# Patient Record
Sex: Female | Born: 1960 | Race: White | Hispanic: No | Smoking: Current every day smoker
Health system: Southern US, Community
[De-identification: ages and names within clinical notes are randomized; demographics above are authoritative.]

## PROBLEM LIST (undated history)

## (undated) DIAGNOSIS — J069 Acute upper respiratory infection, unspecified: Secondary | ICD-10-CM

## (undated) DIAGNOSIS — M1990 Unspecified osteoarthritis, unspecified site: Secondary | ICD-10-CM

## (undated) DIAGNOSIS — F32A Depression, unspecified: Secondary | ICD-10-CM

## (undated) DIAGNOSIS — E782 Mixed hyperlipidemia: Secondary | ICD-10-CM

## (undated) DIAGNOSIS — I10 Essential (primary) hypertension: Secondary | ICD-10-CM

## (undated) DIAGNOSIS — F329 Major depressive disorder, single episode, unspecified: Secondary | ICD-10-CM

## (undated) HISTORY — DX: Mixed hyperlipidemia: E78.2

## (undated) HISTORY — PX: ANKLE FUSION: SHX881

## (undated) HISTORY — PX: OTHER SURGICAL HISTORY: SHX169

---

## 1998-06-22 ENCOUNTER — Other Ambulatory Visit: Admission: RE | Admit: 1998-06-22 | Discharge: 1998-06-22 | Payer: Self-pay | Admitting: *Deleted

## 1999-05-07 ENCOUNTER — Other Ambulatory Visit: Admission: RE | Admit: 1999-05-07 | Discharge: 1999-05-07 | Payer: Self-pay | Admitting: *Deleted

## 2001-05-26 ENCOUNTER — Encounter: Payer: Self-pay | Admitting: Emergency Medicine

## 2001-05-26 ENCOUNTER — Emergency Department (HOSPITAL_COMMUNITY): Admission: EM | Admit: 2001-05-26 | Discharge: 2001-05-26 | Payer: Self-pay | Admitting: Emergency Medicine

## 2006-01-10 ENCOUNTER — Ambulatory Visit (HOSPITAL_COMMUNITY): Admission: RE | Admit: 2006-01-10 | Discharge: 2006-01-10 | Payer: Self-pay | Admitting: Internal Medicine

## 2006-08-21 ENCOUNTER — Ambulatory Visit (HOSPITAL_COMMUNITY): Admission: RE | Admit: 2006-08-21 | Discharge: 2006-08-21 | Payer: Self-pay | Admitting: Family Medicine

## 2006-10-17 ENCOUNTER — Ambulatory Visit (HOSPITAL_COMMUNITY): Admission: RE | Admit: 2006-10-17 | Discharge: 2006-10-17 | Payer: Self-pay | Admitting: Internal Medicine

## 2009-11-30 ENCOUNTER — Ambulatory Visit (HOSPITAL_COMMUNITY): Admission: RE | Admit: 2009-11-30 | Discharge: 2009-11-30 | Payer: Self-pay | Admitting: Internal Medicine

## 2010-09-03 NOTE — Procedures (Signed)
NAMESALIHA, SALTS                 ACCOUNT NO.:  [id number]   MEDICAL RECORD NO.:  [id number]          PATIENT TYPE:  OUT   LOCATION:  RESP                          FACILITY:  [facility]   PHYSICIAN:  Edward L. Juanetta Gosling, M.D.DATE OF BIRTH:  1961-02-01   DATE OF PROCEDURE:  DATE OF DISCHARGE:                            PULMONARY FUNCTION TEST   1. Spirometry shows no definite ventilatory defect, but evidence of      airflow obstruction most marked in the smaller airways, but present      in the larger airways as well.  2. Lung volumes show air trapping.  3. DLCO is normal.  4. Arterial blood gases are normal.  5. There is no significant bronchodilator effect.      Edward L. Juanetta Gosling, M.D.  Electronically Signed     ELH/MEDQ  D:  08/21/2006  T:  08/22/2006  Job:  [id number]   cc:   Madelin Rear. Sherwood Gambler, MD  Fax: [phone]

## 2010-11-09 ENCOUNTER — Telehealth: Payer: Self-pay

## 2010-11-09 NOTE — Telephone Encounter (Signed)
Molly Herrera called and LMOM to call on 11/04/2010. I called this AM , many rings and no answer.

## 2010-11-11 NOTE — Telephone Encounter (Signed)
Letter mailed to pt to call to get scheduled for a colonoscopy. Referred by Dr. Sherwood Gambler.

## 2011-05-05 ENCOUNTER — Telehealth: Payer: Self-pay

## 2011-05-05 NOTE — Telephone Encounter (Signed)
Pt referred from Dr. Sherwood Gambler for a screening colonoscopy. ( see med lists) needs OV prior to colonoscopy.

## 2011-05-09 NOTE — Telephone Encounter (Signed)
LMOM to call.

## 2011-05-10 NOTE — Telephone Encounter (Signed)
LMOM to call. Mailing a letter to call also.  

## 2011-08-18 NOTE — H&P (Signed)
NTS SOAP Note  Vital Signs:  Vitals as of: 08/18/2011: Systolic 131: Diastolic 85: Heart Rate 88: Temp 97.71F: Height 38ft 2in: Weight 135Lbs 0 Ounces: OFC Not Entered: Respiratory Rate Not Entered: O2 Saturation Not Entered: Pain Level Not Entered: BMI 25  BMI : 24.69 kg/m2  Subjective: This 34 Years 22 Months old Female presents forscreening TCS.  Never has had a colonoscopy.  Denies any GI complaints.  Review of Symptoms:  Constitutional:fever/cold Head:unremarkable Eyes:blurred vision bilateral Nose/Mouth/Throat:unremarkable Cardiovascular:unremarkable Respiratory:wheezing,cough Gastrointestinal:unremarkable Genitourinary:unremarkable Musculoskeletal:unremarkable Skin:unremarkable Hematolgic/Lymphatic:unremarkable Allergic/Immunologic:unremarkable   Past Medical History:Reviewed   Past Medical History  Surgical History: ankle x 3 Medical Problems:  Emphysema, High Blood pressure Allergies: nkda Medications: hydrocodone, chantix, enalapril, citalopam, xanax, albuterol   Social History:Reviewed  Social History  Preferred Language: English ([place]) Race:  White Ethnicity: Not Hispanic / Latino Age: 51 Years 9 Months Marital Status:  S Alcohol: socially Recreational drug(s):  No   Smoking Status: Current every day smoker reviewed on 08/18/2011 Started Date: 04/18/1978 Packs per day: 0.50   Family History:Reviewed   Family History  Is there a family history of:no family h/o colon cancer    Objective Information: General:Well appearing, well nourished in no distress. Head:Atraumatic; no masses; no abnormalities Heart:RRR, no murmur or gallop.  Normal S1, S2.  No S3, S4.  Lungs:CTA bilaterally, no wheezes, rhonchi, rales.  Breathing unlabored. Abdomen:Soft, NT/ND, no HSM, no masses. deferred to procedure  Assessment:Need for screening TCS  Diagnosis &amp; Procedure:  DiagnosisCode: V76.51, ProcedureCode: 40981,    Plan:Scheduled for TCS on 08/26/11.     Patient Education:Alternative treatments to surgery were discussed with patient (and family).Risks and benefits  of procedure were fully explained to the patient (and family) who gave informed consent. Patient/family questions were addressed.  Follow-up:Pending Surgery

## 2011-08-24 ENCOUNTER — Encounter (HOSPITAL_COMMUNITY): Payer: Self-pay | Admitting: Pharmacy Technician

## 2011-08-25 MED ORDER — SODIUM CHLORIDE 0.45 % IV SOLN
Freq: Once | INTRAVENOUS | Status: AC
  Administered 2011-08-26: 07:00:00 via INTRAVENOUS

## 2011-08-26 ENCOUNTER — Encounter (HOSPITAL_COMMUNITY): Payer: Self-pay

## 2011-08-26 ENCOUNTER — Encounter (HOSPITAL_COMMUNITY)
Admission: RE | Disposition: A | Discharge status: Home / Self Care | Payer: Self-pay | Source: Ambulatory Visit | Attending: General Surgery

## 2011-08-26 ENCOUNTER — Ambulatory Visit (HOSPITAL_COMMUNITY)
Admission: RE | Admit: 2011-08-26 | Discharge: 2011-08-26 | Disposition: A | Discharge status: Home / Self Care | Payer: Medicare Other | Source: Ambulatory Visit | Attending: General Surgery | Admitting: General Surgery

## 2011-08-26 DIAGNOSIS — Z1211 Encounter for screening for malignant neoplasm of colon: Secondary | ICD-10-CM | POA: Insufficient documentation

## 2011-08-26 HISTORY — DX: Acute upper respiratory infection, unspecified: J06.9

## 2011-08-26 HISTORY — DX: Depression, unspecified: F32.A

## 2011-08-26 HISTORY — PX: COLONOSCOPY: SHX5424

## 2011-08-26 HISTORY — DX: Essential (primary) hypertension: I10

## 2011-08-26 HISTORY — DX: Major depressive disorder, single episode, unspecified: F32.9

## 2011-08-26 HISTORY — DX: Unspecified osteoarthritis, unspecified site: M19.90

## 2011-08-26 SURGERY — COLONOSCOPY
Anesthesia: Moderate Sedation

## 2011-08-26 MED ORDER — MEPERIDINE HCL 25 MG/ML IJ SOLN
INTRAMUSCULAR | Status: DC | PRN
  Administered 2011-08-26: 50 mg via INTRAVENOUS
  Administered 2011-08-26: 25 mg via INTRAVENOUS

## 2011-08-26 MED ORDER — MEPERIDINE HCL 100 MG/ML IJ SOLN
INTRAMUSCULAR | Status: AC
  Filled 2011-08-26: qty 2

## 2011-08-26 MED ORDER — STERILE WATER FOR IRRIGATION IR SOLN
Status: DC | PRN
  Administered 2011-08-26: 08:00:00

## 2011-08-26 MED ORDER — MIDAZOLAM HCL 5 MG/5ML IJ SOLN
INTRAMUSCULAR | Status: DC | PRN
  Administered 2011-08-26: 1 mg via INTRAVENOUS
  Administered 2011-08-26: 3 mg via INTRAVENOUS
  Administered 2011-08-26 (×2): 1 mg via INTRAVENOUS

## 2011-08-26 MED ORDER — MIDAZOLAM HCL 5 MG/5ML IJ SOLN
INTRAMUSCULAR | Status: AC
  Filled 2011-08-26: qty 10

## 2011-08-26 NOTE — Interval H&P Note (Signed)
History and Physical Interval Note:  08/26/2011 7:35 AM  Molly Herrera  has presented today for surgery, with the diagnosis of Special screening for malignant neoplasms, colon [V76.51]  The various methods of treatment have been discussed with the patient and family. After consideration of risks, benefits and other options for treatment, the patient has consented to  Procedure(s) (LRB): COLONOSCOPY (N/A) as a surgical intervention .  The patients' history has been reviewed, patient examined, no change in status, stable for surgery.  I have reviewed the patients' chart and labs.  Questions were answered to the patient's satisfaction.     Franky Macho A

## 2011-08-26 NOTE — Op Note (Signed)
[facility] [address] [place], [place]  [address]  COLONOSCOPY PROCEDURE REPORT  PATIENT:  Molly Herrera, Molly Herrera  MR#:  [id number] BIRTHDATE:  06-16-1960, 50 yrs. old  GENDER:  female ENDOSCOPIST:  Franky Macho, MD REF. BY:  Artis Delay, M.D. PROCEDURE DATE:  08/26/2011 PROCEDURE:  Average-risk screening colonoscopy G0121 ASA CLASS:  Class II INDICATIONS:  Screening MEDICATIONS:   Versed 6 mg IV, demerol 75 mg IV  DESCRIPTION OF PROCEDURE:   After the risks benefits and alternatives of the procedure were thoroughly explained, informed consent was obtained.  Digital rectal exam was performed and revealed no abnormalities.   The [id number] ([id number]) endoscope was introduced through the anus and advanced to the cecum, which was identified by both the appendix and ileocecal valve, without limitations.  The quality of the prep was fair..  The instrument was then slowly withdrawn as the colon was fully examined.  FINDINGS:  A normal appearing cecum, ileocecal valve, and appendiceal orifice were identified. The ascending, hepatic flexure, transverse, splenic flexure, descending, sigmoid colon, and rectum appeared unremarkable. Retroflexed views in the rectum revealed it was not tolerated by the patient.  The scope was then withdrawn from the cecum and the procedure completed. COMPLICATIONS:  None ENDOSCOPIC IMPRESSION: 1) Normal colon 2) Normal cecum 3) Normal rectum  RECOMMENDATIONS:  REPEAT EXAM:  In 10 year(s) for Colonoscopy.  ______________________________ Franky Macho, MD  CC:  Elfredia Nevins, MD  n. Rosalie DoctorFranky Macho at 08/26/2011 08:00 AM  Edwinna Areola, [phone]

## 2011-08-26 NOTE — Discharge Instructions (Signed)
Colonoscopy Care After Read the instructions outlined below and refer to this sheet in the next few weeks. These discharge instructions provide you with general information on caring for yourself after you leave the hospital. Your doctor may also give you specific instructions. While your treatment has been planned according to the most current medical practices available, unavoidable complications occasionally occur. If you have any problems or questions after discharge, call your doctor. HOME CARE INSTRUCTIONS ACTIVITY:  You may resume your regular activity, but move at a slower pace for the next 24 hours.   Take frequent rest periods for the next 24 hours.   Walking will help get rid of the air and reduce the bloated feeling in your belly (abdomen).   No driving for 24 hours (because of the medicine (anesthesia) used during the test).   You may shower.   Do not sign any important legal documents or operate any machinery for 24 hours (because of the anesthesia used during the test).  NUTRITION:  Drink plenty of fluids.   You may resume your normal diet as instructed by your doctor.   Begin with a light meal and progress to your normal diet. Heavy or fried foods are harder to digest and may make you feel sick to your stomach (nauseated).   Avoid alcoholic beverages for 24 hours or as instructed.  MEDICATIONS:  You may resume your normal medications unless your doctor tells you otherwise.  WHAT TO EXPECT TODAY:  Some feelings of bloating in the abdomen.   Passage of more gas than usual.   Spotting of blood in your stool or on the toilet paper.  IF YOU HAD POLYPS REMOVED DURING THE COLONOSCOPY:  No aspirin products for 7 days or as instructed.   No alcohol for 7 days or as instructed.   Eat a soft diet for the next 24 hours.  FINDING OUT THE RESULTS OF YOUR TEST Not all test results are available during your visit. If your test results are not back during the visit, make an  appointment with your caregiver to find out the results. Do not assume everything is normal if you have not heard from your caregiver or the medical facility. It is important for you to follow up on all of your test results.  SEEK IMMEDIATE MEDICAL CARE IF:  You have more than a spotting of blood in your stool.   Your belly is swollen (abdominal distention).   You are nauseated or vomiting.   You have a fever.   You have abdominal pain or discomfort that is severe or gets worse throughout the day.  Document Released: 11/17/2003 Document Revised: 03/24/2011 Document Reviewed: 11/15/2007 ExitCare Patient Information 2012 [employer]. 

## 2011-09-02 ENCOUNTER — Encounter (HOSPITAL_COMMUNITY): Payer: Self-pay | Admitting: General Surgery

## 2012-02-09 ENCOUNTER — Other Ambulatory Visit (HOSPITAL_COMMUNITY): Payer: Self-pay | Admitting: Internal Medicine

## 2012-02-09 DIAGNOSIS — Z139 Encounter for screening, unspecified: Secondary | ICD-10-CM

## 2012-02-09 DIAGNOSIS — R945 Abnormal results of liver function studies: Secondary | ICD-10-CM

## 2012-02-13 ENCOUNTER — Other Ambulatory Visit (HOSPITAL_COMMUNITY): Payer: Medicare Other

## 2012-02-17 ENCOUNTER — Ambulatory Visit (HOSPITAL_COMMUNITY): Payer: Medicare Other

## 2012-02-24 ENCOUNTER — Ambulatory Visit (HOSPITAL_COMMUNITY)
Admission: RE | Admit: 2012-02-24 | Discharge: 2012-02-24 | Disposition: A | Discharge status: Home / Self Care | Payer: Medicare Other | Source: Ambulatory Visit | Attending: Internal Medicine | Admitting: Internal Medicine

## 2012-02-24 DIAGNOSIS — R945 Abnormal results of liver function studies: Secondary | ICD-10-CM | POA: Insufficient documentation

## 2012-02-24 DIAGNOSIS — Z1231 Encounter for screening mammogram for malignant neoplasm of breast: Secondary | ICD-10-CM | POA: Insufficient documentation

## 2012-02-24 DIAGNOSIS — Z139 Encounter for screening, unspecified: Secondary | ICD-10-CM

## 2012-03-08 ENCOUNTER — Encounter (INDEPENDENT_AMBULATORY_CARE_PROVIDER_SITE_OTHER): Payer: Self-pay | Admitting: *Deleted

## 2012-03-09 ENCOUNTER — Ambulatory Visit (INDEPENDENT_AMBULATORY_CARE_PROVIDER_SITE_OTHER): Payer: Medicare Other | Admitting: Internal Medicine

## 2012-03-09 ENCOUNTER — Encounter (INDEPENDENT_AMBULATORY_CARE_PROVIDER_SITE_OTHER): Payer: Self-pay | Admitting: Internal Medicine

## 2012-03-09 VITALS — BP 126/62 | HR 84 | Temp 98.3°F | Ht 62.0 in | Wt 138.3 lb

## 2012-03-09 DIAGNOSIS — K838 Other specified diseases of biliary tract: Secondary | ICD-10-CM

## 2012-03-09 NOTE — Progress Notes (Signed)
Subjective:     Patient ID: Molly Herrera, female   DOB: 06-Sep-1960, 51 y.o.   MRN: [id number]  HPI Referred to our office by Dr. Sherwood Gambler for dilated CBD. She had blood work and liver enzymes were elevated. She underwent an [place] (see below). Appetite is good. No weight loss. No fevers. No abdominal pain. She has had nausea.  She has a BM every other day. No bright rectal bleeding or melena. No prior hx of jaundice.  02/08/2012 Total bili 0.9, ALP 79, Abdominal US 02/24/2012 IMPRESSION:  No cholelithiasis or evidence of acute cholecystitis. Mildly  dilated common bile duct proximally which tapers distally. This  can be further evaluated with MRCP if felt clinically indicated.  Cannot exclude distal obstructing lesion or stone as the distal  duct is obscured by bowel gas.  Review of Systems see hpi Current Outpatient Prescriptions  Medication Sig Dispense Refill  . albuterol (PROVENTIL) 2 MG tablet Take 4 mg by mouth 3 (three) times daily as needed. For wheezing       . ALPRAZolam (XANAX) 1 MG tablet Take 1 mg by mouth 4 (four) times daily as needed. For sleep or anxiety       . carboxymethylcellulose (REFRESH PLUS) 0.5 % SOLN Place 1 drop into both eyes 3 (three) times daily.      . citalopram (CELEXA) 10 MG tablet Take 10 mg by mouth every morning.      Marland Kitchen HYDROcodone-acetaminophen (LORTAB) 10-500 MG per tablet Take 1 tablet by mouth every 6 (six) hours as needed. For pain       . Multiple Vitamin (MULITIVITAMIN WITH MINERALS) TABS Take 1 tablet by mouth every morning.      . sodium chloride (MURO 128) 5 % ophthalmic solution 1 drop as needed.      . enalapril (VASOTEC) 5 MG tablet Take 5 mg by mouth every morning.       Past Medical History  Diagnosis Date  . Hypertension   . Asthma   . Recurrent upper respiratory infection (URI)   . Depression   . Arthritis    Past Medical History  Diagnosis Date  . Hypertension   . Asthma   . Recurrent upper respiratory infection (URI)   .  Depression   . Arthritis    Past Surgical History  Procedure Date  . Ankle fusion had 2 surgeries on ankle prior to fusion    right  . Colonoscopy 08/26/2011    Procedure: COLONOSCOPY;  Surgeon: Dalia Heading, MD;  Location: [facility];  Service: Gastroenterology;  Laterality: N/A;   No Known Allergies      Objective:   Physical Exam Filed Vitals:   03/09/12 0947  BP: 126/62  Pulse: 84  Temp: 98.3 F (36.8 C)  Height: 5\' 2"  (1.575 m)  Weight: 138 lb 4.8 oz (62.732 kg)   Alert and oriented. Skin warm and dry. Oral mucosa is moist.   . Sclera anicteric, conjunctivae is pink. Thyroid not enlarged. No cervical lymphadenopathy. Lungs clear. Heart regular rate and rhythm.  Abdomen is soft. Bowel sounds are positive. No hepatomegaly. No abdominal masses felt. No tenderness.  No edema to lower extremities.      Assessment:    Dilated CBD. Stone needs to be ruled. Lesion is also in the differential.   Plan:     MRCP

## 2012-03-19 ENCOUNTER — Ambulatory Visit (HOSPITAL_COMMUNITY)
Admission: RE | Admit: 2012-03-19 | Discharge: 2012-03-19 | Disposition: A | Discharge status: Home / Self Care | Payer: Medicare Other | Source: Ambulatory Visit | Attending: Internal Medicine | Admitting: Internal Medicine

## 2012-03-19 ENCOUNTER — Other Ambulatory Visit (INDEPENDENT_AMBULATORY_CARE_PROVIDER_SITE_OTHER): Payer: Self-pay | Admitting: Internal Medicine

## 2012-03-19 DIAGNOSIS — K5710 Diverticulosis of small intestine without perforation or abscess without bleeding: Secondary | ICD-10-CM | POA: Insufficient documentation

## 2012-03-19 DIAGNOSIS — K838 Other specified diseases of biliary tract: Secondary | ICD-10-CM

## 2012-03-19 MED ORDER — GADOBENATE DIMEGLUMINE 529 MG/ML IV SOLN
12.0000 mL | Freq: Once | INTRAVENOUS | Status: AC | PRN
  Administered 2012-03-19: 12 mL via INTRAVENOUS

## 2012-03-20 ENCOUNTER — Telehealth (INDEPENDENT_AMBULATORY_CARE_PROVIDER_SITE_OTHER): Payer: Self-pay | Admitting: Internal Medicine

## 2012-03-20 DIAGNOSIS — K838 Other specified diseases of biliary tract: Secondary | ICD-10-CM

## 2012-03-20 NOTE — Telephone Encounter (Signed)
Will repeat an hepatic profile. If numbers are elevated, Dr. Karilyn Cota will do an ERCP with sphincterotomy. If normal, we will repeat Hepatic profile and [place] in 2 months.

## 2012-03-21 ENCOUNTER — Encounter (INDEPENDENT_AMBULATORY_CARE_PROVIDER_SITE_OTHER): Payer: Self-pay

## 2012-03-27 LAB — HEPATIC FUNCTION PANEL
Albumin: 4.4 g/dL (ref 3.5–5.2)
Total Bilirubin: 0.5 mg/dL (ref 0.3–1.2)
Total Protein: 6.5 g/dL (ref 6.0–8.3)

## 2012-03-28 ENCOUNTER — Telehealth (INDEPENDENT_AMBULATORY_CARE_PROVIDER_SITE_OTHER): Payer: Self-pay | Admitting: Internal Medicine

## 2012-03-28 NOTE — Telephone Encounter (Signed)
Dr. Karilyn Cota, Don't forget to talk to radiologist concerning her xrays. She has a dilated CBD

## 2012-03-29 ENCOUNTER — Telehealth (INDEPENDENT_AMBULATORY_CARE_PROVIDER_SITE_OTHER): Payer: Self-pay | Admitting: Internal Medicine

## 2012-03-29 NOTE — Telephone Encounter (Signed)
Repeat LFTs and US abdomen in 2 months with Office visit.  I discussed this case with Dr. Karilyn Cota.    Molly Herrera, OV in 2 months with me

## 2012-04-02 ENCOUNTER — Telehealth (INDEPENDENT_AMBULATORY_CARE_PROVIDER_SITE_OTHER): Payer: Self-pay | Admitting: Internal Medicine

## 2012-04-02 NOTE — Telephone Encounter (Signed)
Tammy, Need LFTs in 8 weeks

## 2012-04-02 NOTE — Telephone Encounter (Signed)
[name] and I reviewed imaging studies with the radiologist on 03/30/2013. Plan is for her to have LFTs in 8 weeks followed by office visit.

## 2012-04-04 ENCOUNTER — Encounter (INDEPENDENT_AMBULATORY_CARE_PROVIDER_SITE_OTHER): Payer: Self-pay

## 2012-04-04 NOTE — Telephone Encounter (Signed)
Apt has been made for a 3 month f/u on 07/07/11 with Dorene Ar, NP. Molly Herrera is having a coronary transplante on 05/29/11. Would like to get the lab work, [place] and apt scheduled after the surgery. Forward to [place].

## 2012-04-04 NOTE — Telephone Encounter (Signed)
[place] noted for Feb 2014

## 2012-04-05 ENCOUNTER — Telehealth (INDEPENDENT_AMBULATORY_CARE_PROVIDER_SITE_OTHER): Payer: Self-pay | Admitting: *Deleted

## 2012-04-05 DIAGNOSIS — K838 Other specified diseases of biliary tract: Secondary | ICD-10-CM

## 2012-04-05 NOTE — Telephone Encounter (Signed)
Lab is noted for  8 weeks

## 2012-04-05 NOTE — Telephone Encounter (Signed)
Per Dorene Ar ,NP the patient will need to have labs in 8 weeks

## 2012-04-05 NOTE — Telephone Encounter (Signed)
Lab is noted for  8 weeks 

## 2012-05-17 ENCOUNTER — Encounter (INDEPENDENT_AMBULATORY_CARE_PROVIDER_SITE_OTHER): Payer: Self-pay | Admitting: *Deleted

## 2012-05-17 ENCOUNTER — Telehealth (INDEPENDENT_AMBULATORY_CARE_PROVIDER_SITE_OTHER): Payer: Self-pay | Admitting: *Deleted

## 2012-05-17 DIAGNOSIS — K838 Other specified diseases of biliary tract: Secondary | ICD-10-CM

## 2012-05-17 NOTE — Telephone Encounter (Signed)
Lab order printed

## 2012-07-06 ENCOUNTER — Ambulatory Visit (INDEPENDENT_AMBULATORY_CARE_PROVIDER_SITE_OTHER): Payer: Medicare Other | Admitting: Internal Medicine

## 2012-12-12 ENCOUNTER — Other Ambulatory Visit (INDEPENDENT_AMBULATORY_CARE_PROVIDER_SITE_OTHER): Payer: Self-pay | Admitting: Internal Medicine

## 2012-12-12 ENCOUNTER — Ambulatory Visit (INDEPENDENT_AMBULATORY_CARE_PROVIDER_SITE_OTHER): Payer: Medicare Other | Admitting: Internal Medicine

## 2012-12-12 ENCOUNTER — Encounter (INDEPENDENT_AMBULATORY_CARE_PROVIDER_SITE_OTHER): Payer: Self-pay | Admitting: Internal Medicine

## 2012-12-12 VITALS — BP 124/70 | HR 72 | Temp 98.2°F | Ht 62.0 in | Wt 140.0 lb

## 2012-12-12 DIAGNOSIS — K831 Obstruction of bile duct: Secondary | ICD-10-CM

## 2012-12-12 NOTE — Progress Notes (Signed)
Subjective:     Patient ID: Molly Herrera, female   DOB: [birth year], 52 y.o.   MRN: [id number]  HPI Here today for f/u of a dilated CBD. She was last seen in November of 2013. She did not follow up in our office for a repeat US and lab work. She tells corneal transplants in February and June of this year.   She has a hx of Glaucoma.  She tells me she has had some left upper quadrant pain. She has had some nausea associated with her symptoms. Pain started 2-3 months ago. She says the pain is steadily getting worse. There is no pain today.  Usually hurts in the morning and as the day progresses the pain will resolve. Appetite has been good. No weight loss. She is having a BM about once every other day. No bright red rectal bleeding or melena.  No prior of jaundice. She has 2-3 mixed drinks a day and drinks an 18 pack on the weekends over a 2 day period.   Hepatic Function Panel     Component Value Date/Time   PROT 6.5 03/20/2012 1633   ALBUMIN 4.4 03/20/2012 1633   AST 44* 03/20/2012 1633   ALT 45* 03/20/2012 1633   ALKPHOS 76 03/20/2012 1633   BILITOT 0.5 03/20/2012 1633   BILIDIR 0.1 03/20/2012 1633   IBILI 0.4 03/20/2012 1633     03/09/12 MRCP: dilated CBD 1. Mild intra hepatic ductal dilatation and common bile duct  dilatation but no common bile duct stones or pancreatic head mass  to account for this finding. There could be a distal common bile  duct stricture.  2. The pancreatic duct is normal in caliber and has a normal  course.  3. Normal gallbladder. No gallstones.  4. 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     Notes Recorded by Len Blalock, NP on 03/20/2012 at 4:32 PM Will repeat a hepatic function. If elevated ERCP with sphincterotomy. If normal. Hepatic function and US abdomen in 2 months. I discussed this case with Dr. Karilyn Cota.     Review of Systems see hpi Current Outpatient Prescriptions  Medication Sig Dispense Refill  . albuterol (PROVENTIL) 2 MG tablet Take 4  mg by mouth 3 (three) times daily as needed. For wheezing       . ALPRAZolam (XANAX) 1 MG tablet Take 1 mg by mouth 4 (four) times daily as needed. For sleep or anxiety       . citalopram (CELEXA) 10 MG tablet Take 10 mg by mouth every morning.      . enalapril (VASOTEC) 5 MG tablet Take 5 mg by mouth every morning.      Marland Kitchen HYDROcodone-acetaminophen (LORTAB) 10-500 MG per tablet Take 1 tablet by mouth every 6 (six) hours as needed. For pain       . prednisoLONE acetate (PRED FORTE) 1 % ophthalmic suspension Place 1 drop into both eyes 4 (four) times daily. Rt eye three times a day, left once a day       No current facility-administered medications for this visit.   Past Medical History  Diagnosis Date  . Hypertension   . Asthma   . Recurrent upper respiratory infection (URI)   . Depression   . Arthritis    Past Surgical History  Procedure Laterality Date  . Ankle fusion  had 2 surgeries on ankle prior to fusion    right  . Colonoscopy  08/26/2011    Procedure: COLONOSCOPY;  Surgeon: Loraine Leriche  Val Riles, MD;  Location: [facility];  Service: Gastroenterology;  Laterality: N/A;  . Corneal transplants      2014   No Known Allergies      Objective:   Physical Exam  Filed Vitals:   12/12/12 0946  BP: 124/70  Pulse: 72  Temp: 98.2 F (36.8 C)  Height: 5\' 2"  (1.575 m)  Weight: 140 lb (63.504 kg)   Alert and oriented. Skin warm and dry. Oral mucosa is moist.   . Sclera anicteric, conjunctivae is pink. Thyroid not enlarged. No cervical lymphadenopathy. Lungs clear. Heart regular rate and rhythm.  Abdomen is soft. Bowel sounds are positive. No hepatomegaly. No abdominal masses felt. No tenderness.  No edema to lower extremities.       Assessment:    Hx of dilated CBD. ? etiology   Patient was to follow up with [place] several months ago but did not. She had corneal implants and post-poned labs and [place].  Plan:    Cmet, Amylase, US abdomen. If abnormal, I need to discuss results with Dr.  Karilyn Cota after I have the results. She may need an ERCP.   Also advised she needed to decrease her drinking.

## 2012-12-12 NOTE — Patient Instructions (Addendum)
CMET and US abdomen, Amylase. Further recommendations to follow.

## 2012-12-19 ENCOUNTER — Other Ambulatory Visit (INDEPENDENT_AMBULATORY_CARE_PROVIDER_SITE_OTHER): Payer: Self-pay | Admitting: Internal Medicine

## 2012-12-19 ENCOUNTER — Ambulatory Visit (HOSPITAL_COMMUNITY)
Admission: RE | Admit: 2012-12-19 | Discharge: 2012-12-19 | Disposition: A | Discharge status: Home / Self Care | Payer: Medicare Other | Source: Ambulatory Visit | Attending: Internal Medicine | Admitting: Internal Medicine

## 2012-12-19 ENCOUNTER — Telehealth (INDEPENDENT_AMBULATORY_CARE_PROVIDER_SITE_OTHER): Payer: Self-pay | Admitting: Internal Medicine

## 2012-12-19 DIAGNOSIS — K831 Obstruction of bile duct: Secondary | ICD-10-CM

## 2012-12-19 DIAGNOSIS — K7689 Other specified diseases of liver: Secondary | ICD-10-CM | POA: Insufficient documentation

## 2012-12-19 DIAGNOSIS — K838 Other specified diseases of biliary tract: Secondary | ICD-10-CM | POA: Insufficient documentation

## 2012-12-19 NOTE — Telephone Encounter (Signed)
erroir

## 2012-12-20 LAB — COMPREHENSIVE METABOLIC PANEL
ALT: 37 U/L — ABNORMAL HIGH (ref 0–35)
AST: 47 U/L — ABNORMAL HIGH (ref 0–37)
Albumin: 4.5 g/dL (ref 3.5–5.2)
CO2: 27 mEq/L (ref 19–32)
Calcium: 9.4 mg/dL (ref 8.4–10.5)
Chloride: 102 mEq/L (ref 96–112)
Potassium: 4 mEq/L (ref 3.5–5.3)
Total Protein: 6.5 g/dL (ref 6.0–8.3)

## 2012-12-20 LAB — AMYLASE: Amylase: 68 U/L (ref 0–105)

## 2012-12-26 NOTE — Progress Notes (Signed)
Has an apt scheduled for 02/11/13 with Dr. Karilyn Cota.

## 2012-12-27 ENCOUNTER — Telehealth (INDEPENDENT_AMBULATORY_CARE_PROVIDER_SITE_OTHER): Payer: Self-pay | Admitting: *Deleted

## 2012-12-27 NOTE — Telephone Encounter (Signed)
.  Per Delrae Rend the patient will need to have labs in 3 months and a office visit. Labs have been noted for March 28, 2013.

## 2012-12-28 NOTE — Progress Notes (Signed)
Apt has been scheduled for 04/08/13 with Dr. Karilyn Cota.

## 2012-12-28 NOTE — Telephone Encounter (Signed)
Apt has been changed to 04/08/13 with Dr. Karilyn Cota.

## 2013-02-11 ENCOUNTER — Ambulatory Visit (INDEPENDENT_AMBULATORY_CARE_PROVIDER_SITE_OTHER): Payer: Medicare Other | Admitting: Internal Medicine

## 2013-03-06 ENCOUNTER — Encounter (INDEPENDENT_AMBULATORY_CARE_PROVIDER_SITE_OTHER): Payer: Self-pay | Admitting: *Deleted

## 2013-03-06 ENCOUNTER — Other Ambulatory Visit (INDEPENDENT_AMBULATORY_CARE_PROVIDER_SITE_OTHER): Payer: Self-pay | Admitting: *Deleted

## 2013-04-08 ENCOUNTER — Ambulatory Visit (INDEPENDENT_AMBULATORY_CARE_PROVIDER_SITE_OTHER): Payer: Medicare Other | Admitting: Internal Medicine

## 2013-05-23 ENCOUNTER — Other Ambulatory Visit (HOSPITAL_COMMUNITY): Payer: Self-pay | Admitting: Family Medicine

## 2013-05-23 ENCOUNTER — Ambulatory Visit (HOSPITAL_COMMUNITY)
Admission: RE | Admit: 2013-05-23 | Discharge: 2013-05-23 | Disposition: A | Discharge status: Home / Self Care | Payer: Medicare Other | Source: Ambulatory Visit | Attending: Family Medicine | Admitting: Family Medicine

## 2013-05-23 DIAGNOSIS — R0602 Shortness of breath: Secondary | ICD-10-CM | POA: Insufficient documentation

## 2013-05-23 DIAGNOSIS — R059 Cough, unspecified: Secondary | ICD-10-CM

## 2013-05-23 DIAGNOSIS — R05 Cough: Secondary | ICD-10-CM

## 2014-04-25 DIAGNOSIS — F419 Anxiety disorder, unspecified: Secondary | ICD-10-CM | POA: Diagnosis not present

## 2014-04-25 DIAGNOSIS — I10 Essential (primary) hypertension: Secondary | ICD-10-CM | POA: Diagnosis not present

## 2014-04-25 DIAGNOSIS — Z6824 Body mass index (BMI) 24.0-24.9, adult: Secondary | ICD-10-CM | POA: Diagnosis not present

## 2014-04-25 DIAGNOSIS — J0100 Acute maxillary sinusitis, unspecified: Secondary | ICD-10-CM | POA: Diagnosis not present

## 2014-04-25 DIAGNOSIS — G894 Chronic pain syndrome: Secondary | ICD-10-CM | POA: Diagnosis not present

## 2014-05-16 DIAGNOSIS — Z947 Corneal transplant status: Secondary | ICD-10-CM | POA: Diagnosis not present

## 2014-05-16 DIAGNOSIS — H1851 Endothelial corneal dystrophy: Secondary | ICD-10-CM | POA: Diagnosis not present

## 2014-07-25 DIAGNOSIS — G894 Chronic pain syndrome: Secondary | ICD-10-CM | POA: Diagnosis not present

## 2014-07-25 DIAGNOSIS — Z6823 Body mass index (BMI) 23.0-23.9, adult: Secondary | ICD-10-CM | POA: Diagnosis not present

## 2014-07-25 DIAGNOSIS — F419 Anxiety disorder, unspecified: Secondary | ICD-10-CM | POA: Diagnosis not present

## 2014-09-26 DIAGNOSIS — Z0000 Encounter for general adult medical examination without abnormal findings: Secondary | ICD-10-CM | POA: Diagnosis not present

## 2014-09-26 DIAGNOSIS — J45909 Unspecified asthma, uncomplicated: Secondary | ICD-10-CM | POA: Diagnosis not present

## 2014-09-26 DIAGNOSIS — Z1389 Encounter for screening for other disorder: Secondary | ICD-10-CM | POA: Diagnosis not present

## 2014-09-26 DIAGNOSIS — Z6824 Body mass index (BMI) 24.0-24.9, adult: Secondary | ICD-10-CM | POA: Diagnosis not present

## 2015-01-21 DIAGNOSIS — F419 Anxiety disorder, unspecified: Secondary | ICD-10-CM | POA: Diagnosis not present

## 2015-01-21 DIAGNOSIS — J019 Acute sinusitis, unspecified: Secondary | ICD-10-CM | POA: Diagnosis not present

## 2015-01-21 DIAGNOSIS — Z6826 Body mass index (BMI) 26.0-26.9, adult: Secondary | ICD-10-CM | POA: Diagnosis not present

## 2015-01-21 DIAGNOSIS — E663 Overweight: Secondary | ICD-10-CM | POA: Diagnosis not present

## 2015-01-21 DIAGNOSIS — Z23 Encounter for immunization: Secondary | ICD-10-CM | POA: Diagnosis not present

## 2015-01-21 DIAGNOSIS — G894 Chronic pain syndrome: Secondary | ICD-10-CM | POA: Diagnosis not present

## 2015-01-29 ENCOUNTER — Other Ambulatory Visit (HOSPITAL_COMMUNITY): Payer: Self-pay | Admitting: Physician Assistant

## 2015-01-29 DIAGNOSIS — Z1231 Encounter for screening mammogram for malignant neoplasm of breast: Secondary | ICD-10-CM

## 2015-02-06 ENCOUNTER — Ambulatory Visit (HOSPITAL_COMMUNITY): Payer: Self-pay

## 2015-02-09 ENCOUNTER — Ambulatory Visit (HOSPITAL_COMMUNITY)
Admission: RE | Admit: 2015-02-09 | Discharge: 2015-02-09 | Disposition: A | Discharge status: Home / Self Care | Payer: Medicare Other | Source: Ambulatory Visit | Attending: Physician Assistant | Admitting: Physician Assistant

## 2015-02-09 DIAGNOSIS — R921 Mammographic calcification found on diagnostic imaging of breast: Secondary | ICD-10-CM | POA: Insufficient documentation

## 2015-02-09 DIAGNOSIS — Z1231 Encounter for screening mammogram for malignant neoplasm of breast: Secondary | ICD-10-CM | POA: Insufficient documentation

## 2015-02-12 ENCOUNTER — Other Ambulatory Visit: Payer: Self-pay | Admitting: Physician Assistant

## 2015-02-12 DIAGNOSIS — R928 Other abnormal and inconclusive findings on diagnostic imaging of breast: Secondary | ICD-10-CM

## 2015-02-13 DIAGNOSIS — H1851 Endothelial corneal dystrophy: Secondary | ICD-10-CM | POA: Diagnosis not present

## 2015-02-13 DIAGNOSIS — Z947 Corneal transplant status: Secondary | ICD-10-CM | POA: Diagnosis not present

## 2015-02-24 ENCOUNTER — Ambulatory Visit (HOSPITAL_COMMUNITY)
Admission: RE | Admit: 2015-02-24 | Discharge: 2015-02-24 | Disposition: A | Discharge status: Home / Self Care | Payer: Medicare Other | Source: Ambulatory Visit | Attending: Physician Assistant | Admitting: Physician Assistant

## 2015-02-24 DIAGNOSIS — R921 Mammographic calcification found on diagnostic imaging of breast: Secondary | ICD-10-CM | POA: Diagnosis not present

## 2015-02-24 DIAGNOSIS — R928 Other abnormal and inconclusive findings on diagnostic imaging of breast: Secondary | ICD-10-CM

## 2015-04-16 DIAGNOSIS — Z1389 Encounter for screening for other disorder: Secondary | ICD-10-CM | POA: Diagnosis not present

## 2015-04-16 DIAGNOSIS — F419 Anxiety disorder, unspecified: Secondary | ICD-10-CM | POA: Diagnosis not present

## 2015-04-16 DIAGNOSIS — Z6824 Body mass index (BMI) 24.0-24.9, adult: Secondary | ICD-10-CM | POA: Diagnosis not present

## 2015-04-16 DIAGNOSIS — G894 Chronic pain syndrome: Secondary | ICD-10-CM | POA: Diagnosis not present

## 2015-06-24 DIAGNOSIS — J209 Acute bronchitis, unspecified: Secondary | ICD-10-CM | POA: Diagnosis not present

## 2015-06-24 DIAGNOSIS — Z1389 Encounter for screening for other disorder: Secondary | ICD-10-CM | POA: Diagnosis not present

## 2015-09-24 ENCOUNTER — Other Ambulatory Visit (HOSPITAL_COMMUNITY): Payer: Self-pay | Admitting: Physician Assistant

## 2015-09-24 DIAGNOSIS — Z09 Encounter for follow-up examination after completed treatment for conditions other than malignant neoplasm: Secondary | ICD-10-CM

## 2015-09-24 DIAGNOSIS — R921 Mammographic calcification found on diagnostic imaging of breast: Secondary | ICD-10-CM

## 2015-10-06 ENCOUNTER — Ambulatory Visit (HOSPITAL_COMMUNITY)
Admission: RE | Admit: 2015-10-06 | Discharge: 2015-10-06 | Disposition: A | Discharge status: Home / Self Care | Payer: Medicare Other | Source: Ambulatory Visit | Attending: Physician Assistant | Admitting: Physician Assistant

## 2015-10-06 DIAGNOSIS — R921 Mammographic calcification found on diagnostic imaging of breast: Secondary | ICD-10-CM | POA: Insufficient documentation

## 2015-10-06 DIAGNOSIS — Z09 Encounter for follow-up examination after completed treatment for conditions other than malignant neoplasm: Secondary | ICD-10-CM | POA: Diagnosis not present

## 2015-11-13 ENCOUNTER — Other Ambulatory Visit: Payer: Self-pay | Admitting: Family Medicine

## 2015-11-13 ENCOUNTER — Other Ambulatory Visit (HOSPITAL_COMMUNITY): Payer: Self-pay | Admitting: Physician Assistant

## 2015-11-13 DIAGNOSIS — R921 Mammographic calcification found on diagnostic imaging of breast: Secondary | ICD-10-CM

## 2015-11-13 DIAGNOSIS — Z09 Encounter for follow-up examination after completed treatment for conditions other than malignant neoplasm: Secondary | ICD-10-CM

## 2015-12-01 ENCOUNTER — Encounter (HOSPITAL_COMMUNITY): Payer: Medicare Other

## 2016-02-16 ENCOUNTER — Encounter (HOSPITAL_COMMUNITY): Payer: Medicare Other

## 2016-02-23 ENCOUNTER — Ambulatory Visit (HOSPITAL_COMMUNITY)
Admission: RE | Admit: 2016-02-23 | Discharge: 2016-02-23 | Disposition: A | Discharge status: Home / Self Care | Payer: Medicare Other | Source: Ambulatory Visit | Attending: Physician Assistant | Admitting: Physician Assistant

## 2016-02-23 ENCOUNTER — Encounter (HOSPITAL_COMMUNITY): Payer: Medicare Other

## 2016-02-23 DIAGNOSIS — R928 Other abnormal and inconclusive findings on diagnostic imaging of breast: Secondary | ICD-10-CM | POA: Diagnosis not present

## 2016-02-23 DIAGNOSIS — R921 Mammographic calcification found on diagnostic imaging of breast: Secondary | ICD-10-CM | POA: Diagnosis not present

## 2016-02-23 DIAGNOSIS — Z09 Encounter for follow-up examination after completed treatment for conditions other than malignant neoplasm: Secondary | ICD-10-CM

## 2016-08-23 DIAGNOSIS — I10 Essential (primary) hypertension: Secondary | ICD-10-CM | POA: Diagnosis not present

## 2016-08-23 DIAGNOSIS — Z1389 Encounter for screening for other disorder: Secondary | ICD-10-CM | POA: Diagnosis not present

## 2016-08-23 DIAGNOSIS — G894 Chronic pain syndrome: Secondary | ICD-10-CM | POA: Diagnosis not present

## 2016-08-23 DIAGNOSIS — G4700 Insomnia, unspecified: Secondary | ICD-10-CM | POA: Diagnosis not present

## 2016-12-01 DIAGNOSIS — Z01419 Encounter for gynecological examination (general) (routine) without abnormal findings: Secondary | ICD-10-CM | POA: Diagnosis not present

## 2016-12-01 DIAGNOSIS — Z1389 Encounter for screening for other disorder: Secondary | ICD-10-CM | POA: Diagnosis not present

## 2016-12-01 DIAGNOSIS — J45909 Unspecified asthma, uncomplicated: Secondary | ICD-10-CM | POA: Diagnosis not present

## 2016-12-01 DIAGNOSIS — E785 Hyperlipidemia, unspecified: Secondary | ICD-10-CM | POA: Diagnosis not present

## 2016-12-01 DIAGNOSIS — I10 Essential (primary) hypertension: Secondary | ICD-10-CM | POA: Diagnosis not present

## 2017-04-19 ENCOUNTER — Ambulatory Visit (HOSPITAL_COMMUNITY)
Admission: RE | Admit: 2017-04-19 | Discharge: 2017-04-19 | Disposition: A | Discharge status: Home / Self Care | Payer: Medicare Other | Source: Ambulatory Visit | Attending: Internal Medicine | Admitting: Internal Medicine

## 2017-04-19 ENCOUNTER — Encounter (HOSPITAL_COMMUNITY): Payer: Self-pay | Admitting: *Deleted

## 2017-04-19 ENCOUNTER — Emergency Department (HOSPITAL_COMMUNITY)
Admission: EM | Admit: 2017-04-19 | Discharge: 2017-04-19 | Disposition: A | Discharge status: Home / Self Care | Payer: Medicare Other | Attending: Emergency Medicine | Admitting: Emergency Medicine

## 2017-04-19 ENCOUNTER — Emergency Department (HOSPITAL_COMMUNITY): Payer: Medicare Other

## 2017-04-19 ENCOUNTER — Other Ambulatory Visit (HOSPITAL_COMMUNITY): Payer: Self-pay | Admitting: Internal Medicine

## 2017-04-19 ENCOUNTER — Other Ambulatory Visit: Payer: Self-pay

## 2017-04-19 DIAGNOSIS — Z79899 Other long term (current) drug therapy: Secondary | ICD-10-CM | POA: Diagnosis not present

## 2017-04-19 DIAGNOSIS — I10 Essential (primary) hypertension: Secondary | ICD-10-CM | POA: Diagnosis not present

## 2017-04-19 DIAGNOSIS — Z23 Encounter for immunization: Secondary | ICD-10-CM | POA: Diagnosis not present

## 2017-04-19 DIAGNOSIS — J45909 Unspecified asthma, uncomplicated: Secondary | ICD-10-CM | POA: Insufficient documentation

## 2017-04-19 DIAGNOSIS — Y929 Unspecified place or not applicable: Secondary | ICD-10-CM | POA: Diagnosis not present

## 2017-04-19 DIAGNOSIS — E876 Hypokalemia: Secondary | ICD-10-CM

## 2017-04-19 DIAGNOSIS — F17210 Nicotine dependence, cigarettes, uncomplicated: Secondary | ICD-10-CM | POA: Insufficient documentation

## 2017-04-19 DIAGNOSIS — Y939 Activity, unspecified: Secondary | ICD-10-CM | POA: Diagnosis not present

## 2017-04-19 DIAGNOSIS — R0602 Shortness of breath: Secondary | ICD-10-CM | POA: Diagnosis not present

## 2017-04-19 DIAGNOSIS — J181 Lobar pneumonia, unspecified organism: Secondary | ICD-10-CM | POA: Insufficient documentation

## 2017-04-19 DIAGNOSIS — S2232XA Fracture of one rib, left side, initial encounter for closed fracture: Secondary | ICD-10-CM | POA: Insufficient documentation

## 2017-04-19 DIAGNOSIS — S299XXA Unspecified injury of thorax, initial encounter: Secondary | ICD-10-CM

## 2017-04-19 DIAGNOSIS — Y998 Other external cause status: Secondary | ICD-10-CM | POA: Insufficient documentation

## 2017-04-19 DIAGNOSIS — J189 Pneumonia, unspecified organism: Secondary | ICD-10-CM

## 2017-04-19 DIAGNOSIS — J9819 Other pulmonary collapse: Secondary | ICD-10-CM | POA: Insufficient documentation

## 2017-04-19 DIAGNOSIS — W010XXA Fall on same level from slipping, tripping and stumbling without subsequent striking against object, initial encounter: Secondary | ICD-10-CM | POA: Insufficient documentation

## 2017-04-19 DIAGNOSIS — J329 Chronic sinusitis, unspecified: Secondary | ICD-10-CM | POA: Diagnosis not present

## 2017-04-19 DIAGNOSIS — S2242XA Multiple fractures of ribs, left side, initial encounter for closed fracture: Secondary | ICD-10-CM | POA: Diagnosis not present

## 2017-04-19 LAB — CBC WITH DIFFERENTIAL/PLATELET
BASOS PCT: 0 %
Basophils Absolute: 0 10*3/uL (ref 0.0–0.1)
EOS ABS: 0 10*3/uL (ref 0.0–0.7)
Eosinophils Relative: 0 %
HCT: 34.4 % — ABNORMAL LOW (ref 36.0–46.0)
HEMOGLOBIN: 11.3 g/dL — AB (ref 12.0–15.0)
Lymphocytes Relative: 7 %
Lymphs Abs: 1 10*3/uL (ref 0.7–4.0)
MCH: 32.7 pg (ref 26.0–34.0)
MCHC: 32.8 g/dL (ref 30.0–36.0)
MCV: 99.4 fL (ref 78.0–100.0)
Monocytes Absolute: 1.3 10*3/uL — ABNORMAL HIGH (ref 0.1–1.0)
Monocytes Relative: 9 %
NEUTROS PCT: 84 %
Neutro Abs: 11.3 10*3/uL — ABNORMAL HIGH (ref 1.7–7.7)
Platelets: 321 10*3/uL (ref 150–400)
RBC: 3.46 MIL/uL — AB (ref 3.87–5.11)
RDW: 13.5 % (ref 11.5–15.5)
WBC: 13.6 10*3/uL — AB (ref 4.0–10.5)

## 2017-04-19 LAB — COMPREHENSIVE METABOLIC PANEL
ALK PHOS: 105 U/L (ref 38–126)
ALT: 17 U/L (ref 14–54)
AST: 22 U/L (ref 15–41)
Albumin: 3.4 g/dL — ABNORMAL LOW (ref 3.5–5.0)
Anion gap: 15 (ref 5–15)
BUN: 15 mg/dL (ref 6–20)
CALCIUM: 9.1 mg/dL (ref 8.9–10.3)
CO2: 22 mmol/L (ref 22–32)
CREATININE: 0.92 mg/dL (ref 0.44–1.00)
Chloride: 99 mmol/L — ABNORMAL LOW (ref 101–111)
Glucose, Bld: 114 mg/dL — ABNORMAL HIGH (ref 65–99)
Potassium: 3.3 mmol/L — ABNORMAL LOW (ref 3.5–5.1)
Sodium: 136 mmol/L (ref 135–145)
TOTAL PROTEIN: 7.5 g/dL (ref 6.5–8.1)
Total Bilirubin: 0.6 mg/dL (ref 0.3–1.2)

## 2017-04-19 LAB — PROTIME-INR
INR: 1.09
PROTHROMBIN TIME: 14 s (ref 11.4–15.2)

## 2017-04-19 LAB — APTT: aPTT: 36 seconds (ref 24–36)

## 2017-04-19 MED ORDER — HYDROCODONE-ACETAMINOPHEN 5-325 MG PO TABS: 1.0000 | ORAL_TABLET | ORAL | 0 refills | Status: DC | PRN

## 2017-04-19 MED ORDER — POTASSIUM CHLORIDE CRYS ER 20 MEQ PO TBCR
40.0000 meq | EXTENDED_RELEASE_TABLET | Freq: Once | ORAL | Status: AC
  Administered 2017-04-19: 40 meq via ORAL
  Filled 2017-04-19: qty 2

## 2017-04-19 MED ORDER — LORAZEPAM 2 MG/ML IJ SOLN
0.5000 mg | Freq: Once | INTRAMUSCULAR | Status: AC
  Administered 2017-04-19: 0.5 mg via INTRAVENOUS
  Filled 2017-04-19: qty 1

## 2017-04-19 MED ORDER — METHOCARBAMOL 500 MG PO TABS: 500.0000 mg | ORAL_TABLET | Freq: Three times a day (TID) | ORAL | 0 refills | Status: DC

## 2017-04-19 MED ORDER — ONDANSETRON HCL 4 MG/2ML IJ SOLN
4.0000 mg | Freq: Once | INTRAMUSCULAR | Status: AC
  Administered 2017-04-19: 4 mg via INTRAVENOUS
  Filled 2017-04-19: qty 2

## 2017-04-19 MED ORDER — MORPHINE SULFATE (PF) 2 MG/ML IV SOLN
2.0000 mg | Freq: Once | INTRAVENOUS | Status: AC
  Administered 2017-04-19: 2 mg via INTRAVENOUS
  Filled 2017-04-19: qty 1

## 2017-04-19 NOTE — ED Triage Notes (Addendum)
Pt fell on Thursday and saw her PCP today. Pt was sent over to [facility] for CXR and pt was found to have rib fracture with partial lung collapse per pt. Pt was told to come to ED for evaluation by her PCP. Pt c/o left rib cage pain and SOB.

## 2017-04-19 NOTE — Discharge Instructions (Signed)
Your labs reveal your potassium to be slightly low.  Please increase foods high in potassium.  Please increase water, Gatorade, juices, Kool-Aid, etc.  Your CT scan shows pneumonia in your upper lungs.  You have could ribs on the left, #9 and #10 mostly in the posterior area.  You have some areas of unexpanded lung or atelectasis.  It is important that you use the incentive spirometer 3 or 4 times each hour.  Please use Robaxin 3 times daily for spasm pain.  Please hold the Ultram for now.  Use Tylenol for mild pain, use hydrocodone for more severe pain.  Hydrocodone and Robaxin may cause drowsiness.  Please use these medications with caution.  Continue your antibiotic as scheduled.  Continue your cough medication and your inhaler as previously ordered.  Please see Dr.Fusco in the next 4 or 5 days for follow-up and recheck.  Please return to the emergency department if any high fever, blood in the mucus, weakness, shortness of breath, or deterioration in your general condition.

## 2017-04-19 NOTE — ED Notes (Signed)
Notified RT of order for incentive spirometry

## 2017-04-19 NOTE — ED Provider Notes (Signed)
[facility] EMERGENCY DEPARTMENT Provider Note   CSN: [id number] Arrival date & time: 04/19/17  1555     History   Chief Complaint Chief Complaint  Patient presents with  . Fall  . Shortness of Breath    HPI Molly Herrera is a 57 y.o. female.  Patient is a 57 year old female who presents to the emergency department following a fall.  The patient states that she fell about 5 days ago and some slick muddy areas and she fell on concrete backwards.  She states that since that time she is been having pain.  She thinks that she developed a bronchitis she is been having cough and she says the cough feels as though it is ripping her in half.  She has been seen by her primary physician.  She has been placed on an inhaler, and antibiotic-Levaquin, and pain medicine- Ultram.  She states the pain seems to be getting worse.  She had a chest x-ray done along with rib films done today and was found to have a slightly displaced ninth left posterior rib.  But she is also has an area that questions pneumonia versus atelectasis, as well as a hemothorax.  She presents to the emergency department at the request of her primary physician for additional evaluation and management.  The patient denies any hemoptysis.  She thinks she may have had some low-grade temperature elevation on yesterday but this was a subjective finding and she did not actually measure her temperature.  She has not had any other injury to the chest.  There is been no recent procedures involving the chest.  It is also of note that the patient is a smoker and has a history of COPD.   The history is provided by the patient.  Fall  Associated symptoms include shortness of breath. Pertinent negatives include no chest pain and no abdominal pain.  Shortness of Breath  Associated symptoms include cough. Pertinent negatives include no neck pain, no wheezing, no chest pain and no abdominal pain.    Past Medical History:  Diagnosis Date  .  Arthritis   . Asthma   . Depression   . Hypertension   . Recurrent upper respiratory infection (URI)     Patient Active Problem List   Diagnosis Date Noted  . 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 ANKLE FUSION  had 2 surgeries on ankle prior to fusion   right  . COLONOSCOPY  08/26/2011   Procedure: COLONOSCOPY;  Surgeon: Dalia HeadingMark A Jenkins, MD;  Location: [facility];  Service: Gastroenterology;  Laterality: N/A;  . Corneal Transplants     2014    OB History    No data available       Home Medications    Prior to Admission medications   Medication Sig Start Date End Date Taking? Authorizing Provider  albuterol (PROVENTIL) 2 MG tablet Take 4 mg by mouth 3 (three) times daily as needed. For wheezing     [provider]  ALPRAZolam Prudy Feeler(XANAX) 1 MG tablet Take 1 mg by mouth 4 (four) times daily as needed. For sleep or anxiety     [provider]  citalopram (CELEXA) 10 MG tablet Take 10 mg by mouth every morning.    [provider]  enalapril (VASOTEC) 5 MG tablet Take 5 mg by mouth every morning.    [provider]  HYDROcodone-acetaminophen (LORTAB) 10-500 MG per tablet Take 1 tablet  by mouth every 6 (six) hours as needed. For pain     [provider]  prednisoLONE acetate (PRED FORTE) 1 % ophthalmic suspension Place 1 drop into both eyes 4 (four) times daily. Rt eye three times a day, left once a day    [provider]    Family History Family History  Problem Relation Age of Onset  . Anesthesia problems Neg Hx   . Hypotension Neg Hx   . Malignant hyperthermia Neg Hx   . Pseudochol deficiency Neg Hx     Social History Social History   Tobacco Use  . Smoking status: Current Every Day Smoker    Packs/day: 0.25    Years: 25.00    Pack years: 6.25    Types: Cigarettes  . Smokeless tobacco: Never Used  Substance Use Topics  . Alcohol use: Yes     Alcohol/week: 7.2 oz    Types: 12 Cans of beer per week    Comment: weekends drinks 18 pack or more. 2-3 drinks a day.   . Drug use: No     Allergies   Patient has no known allergies.   Review of Systems Review of Systems  Constitutional: Negative for activity change.       All ROS Neg except as noted in HPI  HENT: Negative for nosebleeds.   Eyes: Negative for photophobia and discharge.  Respiratory: Positive for cough and shortness of breath. Negative for wheezing.        Chest wall pain  Cardiovascular: Negative for chest pain and palpitations.  Gastrointestinal: Negative for abdominal pain and blood in stool.  Genitourinary: Negative for dysuria, frequency and hematuria.  Musculoskeletal: Negative for arthralgias, back pain and neck pain.  Skin: Negative.   Neurological: Negative for dizziness, seizures and speech difficulty.  Psychiatric/Behavioral: Negative for confusion and hallucinations.     Physical Exam Updated Vital Signs BP 113/73   Pulse 92   Temp 98.4 F (36.9 C) (Oral)   Resp 16   SpO2 93%   Physical Exam  Constitutional: She is oriented to person, place, and time. She appears well-developed and well-nourished.  Non-toxic appearance.  HENT:  Head: Normocephalic.  Right Ear: Tympanic membrane and external ear normal.  Left Ear: Tympanic membrane and external ear normal.  Eyes: EOM and lids are normal. Pupils are equal, round, and reactive to light.  Neck: Normal range of motion. Neck supple. Carotid bruit is not present.  Cardiovascular: Normal rate, regular rhythm, normal heart sounds, intact distal pulses and normal pulses.  Pulmonary/Chest: Breath sounds normal. No respiratory distress.  Patient speaks in complete sentences with minimal problem.  She has pain with cough or with deep breathing on the left. Coarse breath sounds with a few rhonchi present.  Rhonchi more prominent at the left base.  Abdominal: Soft. Bowel sounds are normal. There is no  tenderness. There is no guarding.  Musculoskeletal: Normal range of motion.  Lymphadenopathy:       Head (right side): No submandibular adenopathy present.       Head (left side): No submandibular adenopathy present.    She has no cervical adenopathy.  Neurological: She is alert and oriented to person, place, and time. She has normal strength. No cranial nerve deficit or sensory deficit. Coordination normal.  Skin: Skin is warm and dry. Capillary refill takes less than 2 seconds.  Psychiatric: Her speech is normal. Her mood appears anxious.  Nursing note and vitals reviewed.    ED Treatments / Results  Labs (all labs ordered are listed, but only abnormal results are displayed) Labs Reviewed - No data to display  EKG  EKG Interpretation None       Radiology Dg Ribs Unilateral W/chest Left  Result Date: 04/19/2017 CLINICAL DATA:  Larey Seat 6 days ago, with left-sided posterior chest pain. EXAM: LEFT RIBS AND CHEST - 3+ VIEW COMPARISON:  05/23/2013 FINDINGS: Heart size is normal. Mild atelectasis at the right lung base. Left lower lobe collapse/pneumonia, with left pleural effusion or hemothorax. No pneumothorax. Fracture of the left posterior ninth rib, displaced about 3 mm. No second fracture seen. IMPRESSION: Left posterior ninth rib fracture, displaced about 3 mm. Left lower lobe collapse and/or pneumonia. Small amount of pleural fluid on the left. Electronically Signed   By: Paulina Fusi M.D.   On: 04/19/2017 14:25    Procedures Procedures (including critical care time) FRACTURE CARE. Patient is a 58 year old female who presents to the emergency department because of fractured ribs following a fall.  CT scan shows fracture of the posterior ninth and 10th rib.  I have discussed these fractures with the patient in terms of which she understands.  Of also discussed the fracture care with the patient and she gives permission to proceed.  Patient identified by armband.  Procedural timeout  taken.  Patient provided with incentive spirometer, and given instructions by respiratory therapy.  Patient treated for pain with IV morphine with some improvement in her pain.  Patient tolerated the procedure without problem.  Medications Ordered in ED Medications - No data to display   Initial Impression / Assessment and Plan / ED Course  I have reviewed the triage vital signs and the nursing notes.  Pertinent labs & imaging results that were available during my care of the patient were reviewed by me and considered in my medical decision making (see chart for details).       Final Clinical Impressions(s) / ED Diagnoses MDM Patient was sent to the emergency department at the request of the primary care physician because of a fractured ninth left posterior rib following a fall, and question of pneumonia and/or hemothorax.  Patient is speaking in mostly complete sentences.  Pulse oximetry is 93% on room air.  Patient placed on 2 L of oxygen for comfort. Patient seen with me by Dr. Shela Commons. Mesner.  Complete blood count reveals an elevated white blood cell count of 13,600.  Hemoglobin is slightly low at 11.3 and hematocrit is slightly low at 34.4. The comprehensive metabolic panel shows a potassium to be slightly low at 3.3.  I have reviewed the chest x-ray done earlier today.  CT scan shows nondisplaced fractures of the posterior left ninth and 10th ribs.  There is subtotal left lower lobe atelectasis.  There are scattered mild central infiltrates in the left upper lobe and lingula lingula.  I have instructed the patient on the findings of the lab work as well as the findings on the CT scan.  The patient will increase fluids.  She will monitor temperature closely.  She will use incentive spirometer 3-4 times each hour.  She started Levaquin today.  Have asked her to continue the Levaquin.  She has an inhaler with albuterol and she will continue this.  She has cough medication.  Will add  Robaxin for spasm pain in the fracture area as well as hydrocodone.  The patient will hold the Ultram for now.  The patient is to follow-up with her primary physician in the next for 5  days.  I have instructed the patient to return to the emergency department immediately if any hemoptysis, high fever, weakness, or deterioration in her general condition.  Patient is in agreement with this plan.   Final diagnoses:  Closed fracture of multiple ribs of left side, initial encounter  Community acquired pneumonia of left upper lobe of lung [facility])  Hypokalemia    ED Discharge Orders    None       Ivery Quale, PA-C 04/19/17 1911    Mesner, Barbara Cower, MD 04/19/17 2325

## 2017-09-06 DIAGNOSIS — G4700 Insomnia, unspecified: Secondary | ICD-10-CM | POA: Diagnosis not present

## 2017-09-06 DIAGNOSIS — Z1389 Encounter for screening for other disorder: Secondary | ICD-10-CM | POA: Diagnosis not present

## 2017-11-09 DIAGNOSIS — G894 Chronic pain syndrome: Secondary | ICD-10-CM | POA: Diagnosis not present

## 2017-11-09 DIAGNOSIS — J45909 Unspecified asthma, uncomplicated: Secondary | ICD-10-CM | POA: Diagnosis not present

## 2017-11-09 DIAGNOSIS — J449 Chronic obstructive pulmonary disease, unspecified: Secondary | ICD-10-CM | POA: Diagnosis not present

## 2017-11-09 DIAGNOSIS — I10 Essential (primary) hypertension: Secondary | ICD-10-CM | POA: Diagnosis not present

## 2018-03-22 DIAGNOSIS — G894 Chronic pain syndrome: Secondary | ICD-10-CM | POA: Diagnosis not present

## 2018-03-22 DIAGNOSIS — N029 Recurrent and persistent hematuria with unspecified morphologic changes: Secondary | ICD-10-CM | POA: Diagnosis not present

## 2018-03-22 DIAGNOSIS — Z1389 Encounter for screening for other disorder: Secondary | ICD-10-CM | POA: Diagnosis not present

## 2018-03-22 DIAGNOSIS — J4540 Moderate persistent asthma, uncomplicated: Secondary | ICD-10-CM | POA: Diagnosis not present

## 2018-03-22 DIAGNOSIS — Z0001 Encounter for general adult medical examination with abnormal findings: Secondary | ICD-10-CM | POA: Diagnosis not present

## 2018-03-22 DIAGNOSIS — J449 Chronic obstructive pulmonary disease, unspecified: Secondary | ICD-10-CM | POA: Diagnosis not present

## 2018-03-22 DIAGNOSIS — J45909 Unspecified asthma, uncomplicated: Secondary | ICD-10-CM | POA: Diagnosis not present

## 2018-03-22 DIAGNOSIS — I10 Essential (primary) hypertension: Secondary | ICD-10-CM | POA: Diagnosis not present

## 2018-03-22 DIAGNOSIS — Q742 Other congenital malformations of lower limb(s), including pelvic girdle: Secondary | ICD-10-CM | POA: Diagnosis not present

## 2018-03-22 DIAGNOSIS — E7849 Other hyperlipidemia: Secondary | ICD-10-CM | POA: Diagnosis not present

## 2018-03-27 DIAGNOSIS — Z1389 Encounter for screening for other disorder: Secondary | ICD-10-CM | POA: Diagnosis not present

## 2018-03-27 DIAGNOSIS — Z0001 Encounter for general adult medical examination with abnormal findings: Secondary | ICD-10-CM | POA: Diagnosis not present

## 2018-11-28 DIAGNOSIS — J329 Chronic sinusitis, unspecified: Secondary | ICD-10-CM | POA: Diagnosis not present

## 2018-11-28 DIAGNOSIS — J449 Chronic obstructive pulmonary disease, unspecified: Secondary | ICD-10-CM | POA: Diagnosis not present

## 2018-11-28 DIAGNOSIS — G894 Chronic pain syndrome: Secondary | ICD-10-CM | POA: Diagnosis not present

## 2018-11-28 DIAGNOSIS — I10 Essential (primary) hypertension: Secondary | ICD-10-CM | POA: Diagnosis not present

## 2019-07-05 ENCOUNTER — Ambulatory Visit: Payer: Medicare Other | Attending: Internal Medicine

## 2019-07-05 DIAGNOSIS — Z23 Encounter for immunization: Secondary | ICD-10-CM

## 2019-07-05 NOTE — Progress Notes (Signed)
   Covid-19 Vaccination Clinic  Name:  Molly Herrera    MRN: [id number] DOB: 1960-09-10  07/05/2019  [name] was observed post Covid-19 immunization for 15 minutes without incident. She was provided with Vaccine Information Sheet and instruction to access the V-Safe system.   [name] was instructed to call 911 with any severe reactions post vaccine: Marland Kitchen Difficulty breathing  . Swelling of face and throat  . A fast heartbeat  . A bad rash all over body  . Dizziness and weakness   Immunizations Administered    Name Date Dose VIS Date Route   Moderna COVID-19 Vaccine 07/05/2019  8:33 AM 0.5 mL 03/19/2019 Intramuscular   Manufacturer: Moderna   Lot: [id number]   NDC: 61518-343-73

## 2019-08-02 ENCOUNTER — Ambulatory Visit
Admission: EM | Admit: 2019-08-02 | Discharge: 2019-08-02 | Disposition: A | Discharge status: Home / Self Care | Payer: Medicare Other

## 2019-08-02 ENCOUNTER — Other Ambulatory Visit: Payer: Self-pay

## 2019-08-02 DIAGNOSIS — J209 Acute bronchitis, unspecified: Secondary | ICD-10-CM

## 2019-08-02 DIAGNOSIS — Z20822 Contact with and (suspected) exposure to covid-19: Secondary | ICD-10-CM

## 2019-08-02 DIAGNOSIS — R062 Wheezing: Secondary | ICD-10-CM | POA: Diagnosis not present

## 2019-08-02 DIAGNOSIS — J40 Bronchitis, not specified as acute or chronic: Secondary | ICD-10-CM | POA: Diagnosis not present

## 2019-08-02 MED ORDER — PREDNISONE 10 MG (21) PO TBPK: ORAL_TABLET | Freq: Every day | ORAL | 0 refills | Status: DC

## 2019-08-02 MED ORDER — ALBUTEROL SULFATE (2.5 MG/3ML) 0.083% IN NEBU: 2.5000 mg | INHALATION_SOLUTION | Freq: Four times a day (QID) | RESPIRATORY_TRACT | 0 refills | Status: AC | PRN

## 2019-08-02 MED ORDER — BENZONATATE 100 MG PO CAPS: 100.0000 mg | ORAL_CAPSULE | Freq: Three times a day (TID) | ORAL | 0 refills | Status: DC

## 2019-08-02 MED ORDER — DEXAMETHASONE SODIUM PHOSPHATE 10 MG/ML IJ SOLN
10.0000 mg | Freq: Once | INTRAMUSCULAR | Status: AC
  Administered 2019-08-02: 10 mg via INTRAMUSCULAR

## 2019-08-02 NOTE — ED Provider Notes (Signed)
[place]   [id number] 08/02/19 Arrival Time: 8841   CC: wheezing  SUBJECTIVE: History from: patient.  Molly Herrera is a 59 y.o. female who presents with runny nose, SOB, mildly productive cough, wheezing, and headache x 2 days.  Denies sick exposure to COVID, flu or strep.  Hx significant for asthma, and tobacco use (1PPD x 40+ years), also possible hx of COPD.  Has tried albuterol inhaler without relief.  Denies aggravating factors.  Reports previous symptoms in the past with bronchitis.   Denies fever, chills, fatigue, sinus pain, sore throat, chest pain, nausea, changes in bowel or bladder habits.    ROS: As per HPI.  All other pertinent ROS negative.     Past Medical History:  Diagnosis Date  . Arthritis   . Asthma   . Depression   . Hypertension   . Recurrent upper respiratory infection (URI)    Past Surgical History:  Procedure Laterality Date  . ANKLE FUSION  had 2 surgeries on ankle prior to fusion   right  . COLONOSCOPY  08/26/2011   Procedure: COLONOSCOPY;  Surgeon: Jamesetta So, MD;  Location: [facility];  Service: Gastroenterology;  Laterality: N/A;  . Corneal Transplants     2014   No Known Allergies No current facility-administered medications on file prior to encounter.   Current Outpatient Medications on File Prior to Encounter  Medication Sig Dispense Refill  . albuterol (VENTOLIN HFA) 108 (90 Base) MCG/ACT inhaler Inhale into the lungs every 6 (six) hours as needed for wheezing or shortness of breath.    . budesonide-formoterol (SYMBICORT) 160-4.5 MCG/ACT inhaler Inhale 2 puffs into the lungs 2 (two) times daily.    . busPIRone (BUSPAR) 10 MG tablet Take 10 mg by mouth 3 (three) times daily.    . enalapril (VASOTEC) 5 MG tablet Take 5 mg by mouth every morning.    . lovastatin (MEVACOR) 10 MG tablet Take 10 mg by mouth at bedtime.    . [DISCONTINUED] citalopram (CELEXA) 10 MG tablet Take 10 mg by mouth every morning.     Social History     Socioeconomic History  . Marital status: Divorced    Spouse name: Not on file  . Number of children: Not on file  . Years of education: Not on file  . Highest education level: Not on file  Occupational History  . Not on file  Tobacco Use  . Smoking status: Current Every Day Smoker    Packs/day: 0.25    Years: 25.00    Pack years: 6.25    Types: Cigarettes  . Smokeless tobacco: Never Used  Substance and Sexual Activity  . Alcohol use: Yes    Alcohol/week: 12.0 standard drinks    Types: 12 Cans of beer per week    Comment: weekends drinks 18 pack or more. 2-3 drinks a day.   . Drug use: No  . Sexual activity: Yes    Birth control/protection: Post-menopausal  Other Topics Concern  . Not on file  Social History Narrative  . Not on file   Social Determinants of Health   Financial Resource Strain:   . Difficulty of Paying Living Expenses:   Food Insecurity:   . Worried About Charity fundraiser in the Last Year:   . Arboriculturist in the Last Year:   Transportation Needs:   . Film/video editor (Medical):   Marland Kitchen Lack of Transportation (Non-Medical):   Physical Activity:   . Days  of Exercise per Week:   . Minutes of Exercise per Session:   Stress:   . Feeling of Stress :   Social Connections:   . Frequency of Communication with Friends and Family:   . Frequency of Social Gatherings with Friends and Family:   . Attends Religious Services:   . Active Member of Clubs or Organizations:   . Attends Banker Meetings:   Marland Kitchen Marital Status:   Intimate Partner Violence:   . Fear of Current or Ex-Partner:   . Emotionally Abused:   Marland Kitchen Physically Abused:   . Sexually Abused:    Family History  Problem Relation Age of Onset  . Healthy Mother   . Healthy Father   . Anesthesia problems Neg Hx   . Hypotension Neg Hx   . Malignant hyperthermia Neg Hx   . Pseudochol deficiency Neg Hx     OBJECTIVE:  Vitals:   08/02/19 1556  BP: (!) 163/90  Pulse: 72   Resp: 18  Temp: 98.6 F (37 C)  SpO2: 94%    General appearance: alert; well-appearing, nontoxic; speaking in full sentences and tolerating own secretions HEENT: NCAT; Ears: EACs clear, TMs pearly gray; Eyes: PERRL.  EOM grossly intact. Nose: nares patent without rhinorrhea, Throat: oropharynx clear, tonsils non erythematous or enlarged, uvula midline  Neck: supple without LAD Lungs: unlabored respirations, symmetrical air entry; cough: mild; no respiratory distress; mild subtle wheezes throughout bilateral lung fields Heart: regular rate and rhythm.   Skin: warm and dry Psychological: alert and cooperative; normal mood and affect  ASSESSMENT & PLAN:  1. Wheezing   2. Suspected COVID-19 virus infection   3. Acute bronchitis, unspecified organism     Meds ordered this encounter  Medications  . predniSONE (STERAPRED UNI-PAK 21 TAB) 10 MG (21) TBPK tablet    Sig: Take by mouth daily. Take 6 tabs by mouth daily  for 2 days, then 5 tabs for 2 days, then 4 tabs for 2 days, then 3 tabs for 2 days, 2 tabs for 2 days, then 1 tab by mouth daily for 2 days    Dispense:  42 tablet    Refill:  0    Order Specific Question:   Supervising Provider    Answer:   Eustace Moore [[id number]]  . benzonatate (TESSALON) 100 MG capsule    Sig: Take 1 capsule (100 mg total) by mouth every 8 (eight) hours.    Dispense:  21 capsule    Refill:  0    Order Specific Question:   Supervising Provider    Answer:   Eustace Moore [[id number]]  . albuterol (PROVENTIL) (2.5 MG/3ML) 0.083% nebulizer solution    Sig: Take 3 mLs (2.5 mg total) by nebulization every 6 (six) hours as needed for wheezing or shortness of breath.    Dispense:  75 mL    Refill:  0    Order Specific Question:   Supervising Provider    Answer:   Eustace Moore [[id number]]  . dexamethasone (DECADRON) injection 10 mg    Declines COVID test  In the meantime: You should remain isolated in your home for 10 days from symptom onset  AND greater than 72 hours after symptoms resolution (absence of fever without the use of fever-reducing medication and improvement in respiratory symptoms), whichever is longer Get plenty of rest and push fluids Steroid shot given in office Tessalon Perles prescribed for cough Nebulizer given in office Albuterol solution for shortness of breath Prednisone  prescribed.  Take as directed and to completion Use OTC medications like ibuprofen or tylenol as needed fever or pain Call or go to the ED if you have any new or worsening symptoms such as fever, worsening cough, shortness of breath, chest tightness, chest pain, turning blue, changes in mental status, etc...    Reviewed expectations re: course of current medical issues. Questions answered. Outlined signs and symptoms indicating need for more acute intervention. Patient verbalized understanding. After Visit Summary given.         Rennis Harding, PA-C 08/02/19 1614

## 2019-08-02 NOTE — Discharge Instructions (Addendum)
Declines COVID test  In the meantime: You should remain isolated in your home for 10 days from symptom onset AND greater than 72 hours after symptoms resolution (absence of fever without the use of fever-reducing medication and improvement in respiratory symptoms), whichever is longer Get plenty of rest and push fluids Steroid shot given in office Tessalon Perles prescribed for cough Nebulizer given in office Albuterol solution for shortness of breath Prednisone prescribed.  Take as directed and to completion Use OTC medications like ibuprofen or tylenol as needed fever or pain Call or go to the ED if you have any new or worsening symptoms such as fever, worsening cough, shortness of breath, chest tightness, chest pain, turning blue, changes in mental status, etc..Marland Kitchen

## 2019-08-02 NOTE — ED Triage Notes (Signed)
Pt presents with complaints of shortness of breath, cough, wheezing and headache x 2 days. Pt is not concerned for covid and is requesting to not be tested. Pt states she has hx of bronchitis and has not been exposed to covid.

## 2019-08-06 ENCOUNTER — Ambulatory Visit: Payer: Medicare Other | Attending: Internal Medicine

## 2019-08-06 DIAGNOSIS — Z23 Encounter for immunization: Secondary | ICD-10-CM

## 2019-08-06 NOTE — Progress Notes (Signed)
   Covid-19 Vaccination Clinic  Name:  Molly Herrera    MRN: [id number] DOB: 09-Jul-1960  08/06/2019  [name] was observed post Covid-19 immunization for 15 minutes without incident. She was provided with Vaccine Information Sheet and instruction to access the V-Safe system.   [name] was instructed to call 911 with any severe reactions post vaccine: Marland Kitchen Difficulty breathing  . Swelling of face and throat  . A fast heartbeat  . A bad rash all over body  . Dizziness and weakness   Immunizations Administered    Name Date Dose VIS Date Route   Moderna COVID-19 Vaccine 08/06/2019  8:14 AM 0.5 mL 03/2019 Intramuscular   Manufacturer: Moderna   Lot: [id number]   NDC: 18590-931-12

## 2019-09-01 DIAGNOSIS — J40 Bronchitis, not specified as acute or chronic: Secondary | ICD-10-CM | POA: Diagnosis not present

## 2019-10-02 DIAGNOSIS — J40 Bronchitis, not specified as acute or chronic: Secondary | ICD-10-CM | POA: Diagnosis not present

## 2019-11-01 DIAGNOSIS — J40 Bronchitis, not specified as acute or chronic: Secondary | ICD-10-CM | POA: Diagnosis not present

## 2019-11-20 DIAGNOSIS — Z0001 Encounter for general adult medical examination with abnormal findings: Secondary | ICD-10-CM | POA: Diagnosis not present

## 2019-11-20 DIAGNOSIS — E039 Hypothyroidism, unspecified: Secondary | ICD-10-CM | POA: Diagnosis not present

## 2019-11-20 DIAGNOSIS — F17290 Nicotine dependence, other tobacco product, uncomplicated: Secondary | ICD-10-CM | POA: Diagnosis not present

## 2019-11-20 DIAGNOSIS — N029 Recurrent and persistent hematuria with unspecified morphologic changes: Secondary | ICD-10-CM | POA: Diagnosis not present

## 2019-11-20 DIAGNOSIS — I10 Essential (primary) hypertension: Secondary | ICD-10-CM | POA: Diagnosis not present

## 2019-11-20 DIAGNOSIS — E785 Hyperlipidemia, unspecified: Secondary | ICD-10-CM | POA: Diagnosis not present

## 2019-11-20 DIAGNOSIS — I700 Atherosclerosis of aorta: Secondary | ICD-10-CM | POA: Diagnosis not present

## 2019-11-20 DIAGNOSIS — J449 Chronic obstructive pulmonary disease, unspecified: Secondary | ICD-10-CM | POA: Diagnosis not present

## 2019-11-20 DIAGNOSIS — Z1389 Encounter for screening for other disorder: Secondary | ICD-10-CM | POA: Diagnosis not present

## 2019-11-20 DIAGNOSIS — J4540 Moderate persistent asthma, uncomplicated: Secondary | ICD-10-CM | POA: Diagnosis not present

## 2019-11-21 ENCOUNTER — Other Ambulatory Visit (HOSPITAL_COMMUNITY): Payer: Self-pay | Admitting: Internal Medicine

## 2019-11-21 DIAGNOSIS — Z1231 Encounter for screening mammogram for malignant neoplasm of breast: Secondary | ICD-10-CM

## 2019-12-02 DIAGNOSIS — J40 Bronchitis, not specified as acute or chronic: Secondary | ICD-10-CM | POA: Diagnosis not present

## 2020-01-02 DIAGNOSIS — J40 Bronchitis, not specified as acute or chronic: Secondary | ICD-10-CM | POA: Diagnosis not present

## 2020-02-01 DIAGNOSIS — J40 Bronchitis, not specified as acute or chronic: Secondary | ICD-10-CM | POA: Diagnosis not present

## 2020-03-03 DIAGNOSIS — J40 Bronchitis, not specified as acute or chronic: Secondary | ICD-10-CM | POA: Diagnosis not present

## 2020-03-27 DIAGNOSIS — I10 Essential (primary) hypertension: Secondary | ICD-10-CM | POA: Diagnosis not present

## 2020-03-27 DIAGNOSIS — J449 Chronic obstructive pulmonary disease, unspecified: Secondary | ICD-10-CM | POA: Diagnosis not present

## 2020-03-27 DIAGNOSIS — J209 Acute bronchitis, unspecified: Secondary | ICD-10-CM | POA: Diagnosis not present

## 2020-04-02 DIAGNOSIS — J40 Bronchitis, not specified as acute or chronic: Secondary | ICD-10-CM | POA: Diagnosis not present

## 2020-05-01 DIAGNOSIS — J329 Chronic sinusitis, unspecified: Secondary | ICD-10-CM | POA: Diagnosis not present

## 2020-05-01 DIAGNOSIS — J209 Acute bronchitis, unspecified: Secondary | ICD-10-CM | POA: Diagnosis not present

## 2020-05-01 DIAGNOSIS — J449 Chronic obstructive pulmonary disease, unspecified: Secondary | ICD-10-CM | POA: Diagnosis not present

## 2020-05-03 DIAGNOSIS — J40 Bronchitis, not specified as acute or chronic: Secondary | ICD-10-CM | POA: Diagnosis not present

## 2020-07-18 DIAGNOSIS — S39012A Strain of muscle, fascia and tendon of lower back, initial encounter: Secondary | ICD-10-CM | POA: Diagnosis not present

## 2020-07-18 DIAGNOSIS — R300 Dysuria: Secondary | ICD-10-CM | POA: Diagnosis not present

## 2020-07-18 DIAGNOSIS — M62830 Muscle spasm of back: Secondary | ICD-10-CM | POA: Diagnosis not present

## 2021-06-24 DIAGNOSIS — I10 Essential (primary) hypertension: Secondary | ICD-10-CM | POA: Diagnosis not present

## 2021-06-24 DIAGNOSIS — J441 Chronic obstructive pulmonary disease with (acute) exacerbation: Secondary | ICD-10-CM | POA: Diagnosis not present

## 2021-06-24 DIAGNOSIS — J329 Chronic sinusitis, unspecified: Secondary | ICD-10-CM | POA: Diagnosis not present

## 2021-06-30 ENCOUNTER — Encounter (HOSPITAL_COMMUNITY): Payer: Self-pay

## 2021-06-30 ENCOUNTER — Emergency Department (HOSPITAL_COMMUNITY): Payer: Medicare Other

## 2021-06-30 ENCOUNTER — Other Ambulatory Visit: Payer: Self-pay

## 2021-06-30 ENCOUNTER — Emergency Department (HOSPITAL_COMMUNITY)
Admission: EM | Admit: 2021-06-30 | Discharge: 2021-06-30 | Disposition: A | Discharge status: Home / Self Care | Payer: Medicare Other | Attending: Emergency Medicine | Admitting: Emergency Medicine

## 2021-06-30 DIAGNOSIS — R079 Chest pain, unspecified: Secondary | ICD-10-CM | POA: Insufficient documentation

## 2021-06-30 DIAGNOSIS — F17210 Nicotine dependence, cigarettes, uncomplicated: Secondary | ICD-10-CM | POA: Insufficient documentation

## 2021-06-30 DIAGNOSIS — R0789 Other chest pain: Secondary | ICD-10-CM | POA: Diagnosis not present

## 2021-06-30 DIAGNOSIS — J449 Chronic obstructive pulmonary disease, unspecified: Secondary | ICD-10-CM | POA: Insufficient documentation

## 2021-06-30 DIAGNOSIS — I10 Essential (primary) hypertension: Secondary | ICD-10-CM | POA: Insufficient documentation

## 2021-06-30 DIAGNOSIS — R0602 Shortness of breath: Secondary | ICD-10-CM | POA: Diagnosis not present

## 2021-06-30 DIAGNOSIS — Z7951 Long term (current) use of inhaled steroids: Secondary | ICD-10-CM | POA: Insufficient documentation

## 2021-06-30 LAB — TROPONIN I (HIGH SENSITIVITY)
Troponin I (High Sensitivity): 4 ng/L (ref <18)
Troponin I (High Sensitivity): 5 ng/L (ref <18)

## 2021-06-30 LAB — BASIC METABOLIC PANEL
Anion gap: 12 (ref 5–15)
BUN: 16 mg/dL (ref 8–23)
CO2: 21 mmol/L — ABNORMAL LOW (ref 22–32)
Calcium: 9.5 mg/dL (ref 8.9–10.3)
Chloride: 103 mmol/L (ref 98–111)
Creatinine, Ser: 0.73 mg/dL (ref 0.44–1.00)
GFR, Estimated: >60 mL/min (ref >60)
Glucose, Bld: 105 mg/dL — ABNORMAL HIGH (ref 70–99)
Potassium: 3.6 mmol/L (ref 3.5–5.1)
Sodium: 136 mmol/L (ref 135–145)

## 2021-06-30 LAB — CBC
HCT: 42.8 % (ref 36.0–46.0)
Hemoglobin: 14 g/dL (ref 12.0–15.0)
MCH: 33.2 pg (ref 26.0–34.0)
MCHC: 32.7 g/dL (ref 30.0–36.0)
MCV: 101.4 fL — ABNORMAL HIGH (ref 80.0–100.0)
Platelets: 361 10*3/uL (ref 150–400)
RBC: 4.22 MIL/uL (ref 3.87–5.11)
RDW: 13.2 % (ref 11.5–15.5)
WBC: 11.6 10*3/uL — ABNORMAL HIGH (ref 4.0–10.5)
nRBC: 0 % (ref 0.0–0.2)

## 2021-06-30 NOTE — ED Provider Notes (Signed)
?[facility] ?Provider Note ? ? ?CSN: [id number] ?Arrival date & time: 06/30/21  1256 ? ?  ? ?History ? ?Chief Complaint  ?Patient presents with  ? Chest Pain  ? ? ?Molly Herrera is a 61 y.o. female. ? ?HPI ?Patient presents with a family member assists with the history.  She presents due to episodic chest pain over the past day.  She notes a recent diagnosis of bronchitis.  She is completed steroids, antibiotics, antitussive.  She continues to smoke cigarettes.  Over the past 2 days she has had multiple episodes of brief chest pain.  No lightheadedness, syncope, nausea, vomiting, fever, chills.  No clear precipitating, alleviating, exacerbating factors. ?  ? ?Home Medications ?Prior to Admission medications   ?Medication Sig Start Date End Date Taking? Authorizing Provider  ?albuterol (PROVENTIL) (2.5 MG/3ML) 0.083% nebulizer solution Take 3 mLs (2.5 mg total) by nebulization every 6 (six) hours as needed for wheezing or shortness of breath. 08/02/19   Wurst, [place], PA-C  ?albuterol (VENTOLIN HFA) 108 (90 Base) MCG/ACT inhaler Inhale into the lungs every 6 (six) hours as needed for wheezing or shortness of breath.    [provider]  ?benzonatate (TESSALON) 100 MG capsule Take 1 capsule (100 mg total) by mouth every 8 (eight) hours. 08/02/19   Wurst, [place], PA-C  ?budesonide-formoterol (SYMBICORT) 160-4.5 MCG/ACT inhaler Inhale 2 puffs into the lungs 2 (two) times daily.    [provider]  ?busPIRone (BUSPAR) 10 MG tablet Take 10 mg by mouth 3 (three) times daily.    [provider]  ?enalapril (VASOTEC) 5 MG tablet Take 5 mg by mouth every morning.    [provider]  ?lovastatin (MEVACOR) 10 MG tablet Take 10 mg by mouth at bedtime.    [provider]  ?predniSONE (STERAPRED UNI-PAK 21 TAB) 10 MG (21) TBPK tablet Take by mouth daily. Take 6 tabs by mouth daily  for 2 days, then 5 tabs for 2 days, then 4 tabs for 2 days, then 3 tabs for 2 days, 2  tabs for 2 days, then 1 tab by mouth daily for 2 days 08/02/19   Alvino Chapel, [place], PA-C  ?citalopram (CELEXA) 10 MG tablet Take 10 mg by mouth every morning.  08/02/19  [provider]  ?   ? ?Allergies    ?Patient has no known allergies.   ? ?Review of Systems   ?Review of Systems  ?Constitutional:   ?     Per HPI, otherwise negative  ?HENT:    ?     Per HPI, otherwise negative  ?Respiratory:    ?     Per HPI, otherwise negative  ?Cardiovascular:   ?     Per HPI, otherwise negative  ?Gastrointestinal:  Negative for vomiting.  ?Endocrine:  ?     Negative aside from HPI  ?Genitourinary:   ?     Neg aside from HPI   ?Musculoskeletal:   ?     Per HPI, otherwise negative  ?Skin: Negative.   ?Neurological:  Negative for syncope.  ? ?Physical Exam ?Updated Vital Signs ?BP (!) 180/79   Pulse 81   Temp 98.5 ?F (36.9 ?C) (Oral)   Resp 16   Ht 5\' 2"  (1.575 m)   Wt 66.7 kg   SpO2 97%   BMI 26.89 kg/m?  ?Physical Exam ?Vitals and nursing note reviewed.  ?Constitutional:   ?   General: She is not in acute distress. ?   Appearance: She  is well-developed.  ?HENT:  ?   Head: Normocephalic and atraumatic.  ?Eyes:  ?   Conjunctiva/sclera: Conjunctivae normal.  ?Cardiovascular:  ?   Rate and Rhythm: Normal rate and regular rhythm.  ?Pulmonary:  ?   Effort: Pulmonary effort is normal. No respiratory distress.  ?   Breath sounds: Normal breath sounds. No stridor.  ?Abdominal:  ?   General: There is no distension.  ?Skin: ?   General: Skin is warm and dry.  ?Neurological:  ?   Mental Status: She is alert and oriented to person, place, and time.  ?   Cranial Nerves: No cranial nerve deficit.  ?Psychiatric:     ?   Mood and Affect: Mood normal.  ? ? ?ED Results / Procedures / Treatments   ?Labs ?(all labs ordered are listed, but only abnormal results are displayed) ?Labs Reviewed  ?BASIC METABOLIC PANEL - Abnormal; Notable for the following components:  ?    Result Value  ? CO2 21 (*)   ? Glucose, Bld 105 (*)   ? All other  components within normal limits  ?CBC - Abnormal; Notable for the following components:  ? WBC 11.6 (*)   ? MCV 101.4 (*)   ? All other components within normal limits  ?TROPONIN I (HIGH SENSITIVITY)  ?TROPONIN I (HIGH SENSITIVITY)  ? ? ?EKG ?EKG Interpretation ? ?Date/Time:  Wednesday June 30 2021 13:10:33 EDT ?Ventricular Rate:  93 ?PR Interval:  147 ?QRS Duration: 113 ?QT Interval:  369 ?QTC Calculation: 459 ?R Axis:   80 ?Text Interpretation: Sinus rhythm Right atrial enlargement Incomplete right bundle branch block No old tracing to compare Confirmed by Linwood DibblesKnapp, Jon [phone]([id number]) on 06/30/2021 1:30:32 PM ? ?Radiology ?DG Chest Port 1 View ? ?Result Date: 06/30/2021 ?CLINICAL DATA:  Shortness of breath EXAM: PORTABLE CHEST 1 VIEW COMPARISON:  Chest x-ray dated July 2nd 2019 FINDINGS: The heart size and mediastinal contours are within normal limits. Both lungs are clear. Interval resolution of small left pleural effusion. The visualized skeletal structures are unremarkable. IMPRESSION: No active disease. Electronically Signed   By: Allegra LaiLeah  Strickland M.D.   On: 06/30/2021 13:26   ? ?Procedures ?Procedures  ? ? ?Medications Ordered in ED ?Medications - No data to display ? ?ED Course/ Medical Decision Making/ A&P ?This patient presents to the ED for concern of chest pain, this involves an extensive number of treatment options, and is a complaint that carries with it a high risk of complications and morbidity.  The differential diagnosis includes typical ACS, pneumonia, pneumothorax, less likely dissection, aortic disruption ? ? ?Co morbidities that complicate the patient evaluation ? ?Recent bronchitis, smoking ? ?Social Determinants of Health: ? ?Smoking ? ?Additional history obtained: ? ?Additional history and/or information obtained from friend, chart review ?External records from outside source obtained and reviewed including outpatient visits for COPD 4 times over the past year ? ? ?After the initial evaluation, orders,  including: Labs, x-ray, EKG were initiated. ? ? ?Patient placed on Cardiac and Pulse-Oximetry Monitors. ?The patient was maintained on a cardiac monitor.  The cardiac monitored showed an rhythm of sinus 80s normal ? ?The patient was also maintained on pulse oximetry. The readings were typically 100% room air normal ? ? ?On repeat evaluation of the patient improved ? ?Lab Tests: ? ?I personally interpreted labs.  The pertinent results include: 2 normal troponin, trivial leukocytosis, down from prior ? ?Imaging Studies ordered: ? ?I independently visualized and interpreted imaging which showed no pneumonia ?I agree  with the radiologist interpretation ? ? ? ?Dispostion / Final MDM: ? ?After consideration of the diagnostic results and the patient's response to treatment, she is appropriate for discharge to hospitalization is a consideration given the absence of prior cardiology testing.  Adult female presenting with episodic chest pain in the context of recent recovery from bronchitis.  In general patient is well-appearing, is hemodynamically unremarkable aside from mild hypertension.  Little suspicion for dissection given the preserved pulses bilaterally, absence of neuro complaints.  Patient's heart score is 3, with 2 normal troponin, she is appropriate for discharge, low suspicion for atypical ACS, no evidence for bacteremia, sepsis, pneumonia.  We discussed return precautions explicitly and she will follow-up with her physician tomorrow for appropriate ongoing outpatient testing. ? ?Final Clinical Impression(s) / ED Diagnoses ?Final diagnoses:  ?Chest pain  ? ? ?Rx / DC Orders ?ED Discharge Orders   ? ? None  ? ?  ? ? ?  ?Gerhard Munch, MD ?06/30/21 1631 ? ?

## 2021-06-30 NOTE — ED Triage Notes (Signed)
Pt presents to ED with complaints of mid chest pressure started yesterday. Pt states BP 196/100.  ?

## 2021-06-30 NOTE — Discharge Instructions (Signed)
As discussed, your evaluation today has been largely reassuring.  But, it is important that you monitor your condition carefully, and do not hesitate to return to the ED if you develop new, or concerning changes in your condition. ? ?Otherwise, please follow-up with your physician for appropriate ongoing care. ? ?

## 2021-07-13 DIAGNOSIS — Z0001 Encounter for general adult medical examination with abnormal findings: Secondary | ICD-10-CM | POA: Diagnosis not present

## 2021-07-13 DIAGNOSIS — I10 Essential (primary) hypertension: Secondary | ICD-10-CM | POA: Diagnosis not present

## 2021-07-13 DIAGNOSIS — Z23 Encounter for immunization: Secondary | ICD-10-CM | POA: Diagnosis not present

## 2021-07-13 DIAGNOSIS — J441 Chronic obstructive pulmonary disease with (acute) exacerbation: Secondary | ICD-10-CM | POA: Diagnosis not present

## 2021-07-13 DIAGNOSIS — E559 Vitamin D deficiency, unspecified: Secondary | ICD-10-CM | POA: Diagnosis not present

## 2021-08-18 ENCOUNTER — Encounter: Payer: Self-pay | Admitting: Cardiology

## 2021-08-18 NOTE — Progress Notes (Signed)
? ? ?Cardiology Office Note ? ?Date: 08/19/2021  ? ?ID: Molly Herrera, DOB 21-Sep-1960, MRN [id number] ? ?PCP:  Redmond School, MD  ?Cardiologist:  Rozann Lesches, MD ?Electrophysiologist:  None  ? ?Chief Complaint  ?Patient presents with  ? Chest pain  ? ? ?History of Present Illness: ?Molly Herrera is a 61 y.o. female referred for cardiology consultation by Dr. Gerarda Fraction for the evaluation of chest pain.  I reviewed the available records.  She was seen in the ER back in March with chest discomfort, high-sensitivity troponin I levels were normal.  ECG showed sinus rhythm with right bundle branch block, possible right atrial enlargement. ? ?She states that her stamina has been decreased over the last several months.  Blood pressure elevated from time to time as well, and she has had intermittent episodes of chest tightness without obvious provocation.  Feels anxious when she has these symptoms.  No palpitations or syncope. ? ?She does have a family history of heart disease.  Also personal history of hypertension and hyperlipidemia.  She smokes cigarettes as well. ? ?She has not undergone any prior cardiac testing. ? ?Past Medical History:  ?Diagnosis Date  ? Arthritis   ? Asthma   ? Depression   ? Hypertension   ? Mixed hyperlipidemia   ? Recurrent upper respiratory infection (URI)   ? ? ?Past Surgical History:  ?Procedure Laterality Date  ? ANKLE FUSION  had 2 surgeries on ankle prior to fusion  ? right  ? COLONOSCOPY  08/26/2011  ? Procedure: COLONOSCOPY;  Surgeon: Jamesetta So, MD;  Location: [facility];  Service: Gastroenterology;  Laterality: N/A;  ? 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 ? ?Current Outpatient Medications  ?Medication Sig Dispense Refill  ? acetaminophen (TYLENOL) 500 MG tablet Take 500 mg by mouth as needed.    ? albuterol (PROVENTIL) (2.5 MG/3ML) 0.083% nebulizer solution Take 3 mLs (2.5 mg total) by nebulization every 6 (six) hours as needed for wheezing or shortness of breath. 75 mL 0  ? albuterol  (VENTOLIN HFA) 108 (90 Base) MCG/ACT inhaler Inhale into the lungs every 6 (six) hours as needed for wheezing or shortness of breath.    ? Aspirin-Salicylamide-Caffeine (BC HEADACHE PO) Take by mouth as needed.    ? atorvastatin (LIPITOR) 40 MG tablet Take 40 mg by mouth daily.    ? budesonide-formoterol (SYMBICORT) 160-4.5 MCG/ACT inhaler Inhale 2 puffs into the lungs 2 (two) times daily.    ? busPIRone (BUSPAR) 15 MG tablet Take 15 mg by mouth 2 (two) times daily.    ? Cholecalciferol (VITAMIN D3 PO) Take 2 tablets by mouth daily.    ? diphenhydramine-acetaminophen (TYLENOL PM) 25-500 MG TABS tablet Take 1 tablet by mouth at bedtime as needed.    ? enalapril (VASOTEC) 20 MG tablet Take 20 mg by mouth daily.    ? metoprolol tartrate (LOPRESSOR) 100 MG tablet Take 1 tablet (100 mg total) by mouth once for 1 dose. Take two hours prior to CT 1 tablet 0  ? mirtazapine (REMERON) 45 MG tablet Take 45 mg by mouth daily.    ? TURMERIC PO Take 1 tablet by mouth daily.    ? ?No current facility-administered medications for this visit.  ? ?Allergies:  Patient has no known allergies.  ? ?Social History: The patient  reports that she has been smoking cigarettes. She has a 6.25 pack-year smoking history. She has never used smokeless tobacco. She reports current alcohol use of  about 12.0 standard drinks per week. She reports that she does not use drugs.  ? ?Family History: The patient's family history includes CAD in her mother; Hypertension in her mother; Pancreatic cancer in her father.  ? ?ROS: No orthopnea or PND. ? ?Physical Exam: ?VS:  BP (!) 162/84   Pulse 87   Ht 5\' 2"  (1.575 m)   Wt 147 lb 12.8 oz (67 kg)   SpO2 97%   BMI 27.03 kg/m? , BMI Body mass index is 27.03 kg/m?. ? ?Wt Readings from Last 3 Encounters:  ?08/19/21 147 lb 12.8 oz (67 kg)  ?06/30/21 147 lb (66.7 kg)  ?12/12/12 140 lb (63.5 kg)  ?  ?General: Patient appears comfortable at rest. ?HEENT: Conjunctiva and lids normal, oropharynx clear. ?Neck: Supple,  no elevated JVP or carotid bruits, no thyromegaly. ?Lungs: Decreased breath sounds without wheezing, nonlabored breathing at rest. ?Cardiac: Regular rate and rhythm, no S3 or significant systolic murmur, no pericardial rub. ?Abdomen: Soft, nontender, bowel sounds present. ?Extremities: No pitting edema, distal pulses 2+. ?Skin: Warm and dry. ?Musculoskeletal: No kyphosis. ?Neuropsychiatric: Alert and oriented x3, affect grossly appropriate. ? ?ECG:   No old tracings for review. ? ?Recent Labwork: ?06/30/2021: BUN 16; Creatinine, Ser 0.73; Hemoglobin 14.0; Platelets 361; Potassium 3.6; Sodium 136  ? ?Other Studies Reviewed Today: ? ?Chest x-ray 06/30/2021: ?FINDINGS: ?The heart size and mediastinal contours are within normal limits. ?Both lungs are clear. Interval resolution of small left pleural ?effusion. The visualized skeletal structures are unremarkable. ?  ?IMPRESSION: ?No active disease. ? ?Assessment and Plan: ? ?1.  Recurrent chest tightness and intermittent blood pressure elevation in a 61 year old woman with family history of heart disease, personal history of hypertension and hyperlipidemia.  She is on Vasotec and Lipitor at baseline.  Also longtime tobacco use.  We have discussed further cardiac testing and we will arrange a coronary CTA. ? ?2.  Mixed hyperlipidemia on Lipitor.  She is following with Dr. Gerarda Fraction.  I do not have a recent lipid panel for review. ? ?3.  Essential hypertension, on Vasotec.  Blood pressure elevated today.  May need further medication adjustments depending on trend. ? ?Medication Adjustments/Labs and Tests Ordered: ?Current medicines are reviewed at length with the patient today.  Concerns regarding medicines are outlined above.  ? ?Tests Ordered: ?No orders of the defined types were placed in this encounter. ? ? ?Medication Changes: ?Meds ordered this encounter  ?Medications  ? metoprolol tartrate (LOPRESSOR) 100 MG tablet  ?  Sig: Take 1 tablet (100 mg total) by mouth once for 1  dose. Take two hours prior to CT  ?  Dispense:  1 tablet  ?  Refill:  0  ? ? ?Disposition:  Follow up  test results. ? ?Signed, ?Satira Sark, MD, [facility] ?08/19/2021 9:33 AM    ?[facility] ?[place], [place], [place] [address] ?Phone: [phone]; Fax: [phone]  ?

## 2021-08-19 ENCOUNTER — Encounter: Payer: Self-pay | Admitting: Cardiology

## 2021-08-19 ENCOUNTER — Ambulatory Visit: Payer: Medicare Other | Admitting: Cardiology

## 2021-08-19 VITALS — BP 162/84 | HR 87 | Ht 62.0 in | Wt 147.8 lb

## 2021-08-19 DIAGNOSIS — E782 Mixed hyperlipidemia: Secondary | ICD-10-CM | POA: Diagnosis not present

## 2021-08-19 DIAGNOSIS — Z8249 Family history of ischemic heart disease and other diseases of the circulatory system: Secondary | ICD-10-CM | POA: Diagnosis not present

## 2021-08-19 DIAGNOSIS — Z87898 Personal history of other specified conditions: Secondary | ICD-10-CM

## 2021-08-19 DIAGNOSIS — R079 Chest pain, unspecified: Secondary | ICD-10-CM | POA: Diagnosis not present

## 2021-08-19 DIAGNOSIS — I10 Essential (primary) hypertension: Secondary | ICD-10-CM | POA: Diagnosis not present

## 2021-08-19 MED ORDER — METOPROLOL TARTRATE 100 MG PO TABS: 100.0000 mg | ORAL_TABLET | Freq: Once | ORAL | 0 refills | Status: AC

## 2021-08-19 NOTE — Patient Instructions (Addendum)
Medication Instructions:  ?Your physician recommends that you continue on your current medications as directed. Please refer to the Current Medication list given to you today. ? ?Labwork: ?none ? ?Testing/Procedures: ?Coronary CTA ? ?Follow-Up: ?Your physician recommends that you schedule a follow-up appointment in: Pending ? ?Any Other Special Instructions Will Be Listed Below (If Applicable). ? ?If you need a refill on your cardiac medications before your next appointment, please call your pharmacy. ? ? ? ?Your cardiac CT will be scheduled at one of the below locations:  ? ?[facility] ?[address] ?[place], [place] [address] ?[phone] ? ? ?If scheduled at [facility], please arrive at the [facility] Entrance (Entrance C2) of [facility] 30 minutes prior to test start time. ?You can use the FREE valet parking offered at entrance C (encouraged to control the heart rate for the test)  ?Proceed to the [facility] Radiology Department (first floor) to check-in and test prep. ? ?All radiology patients and guests should use entrance C2 at [facility], accessed from [facility], even though the hospital's physical address listed is [address]. ? ? ? ? ?Please follow these instructions carefully (unless otherwise directed): ? ?On the Night Before the Test: ?Be sure to Drink plenty of water. ?Do not consume any caffeinated/decaffeinated beverages or chocolate 12 hours prior to your test. ?Do not take any antihistamines (Tylenol PM) 12 hours prior to your test. ? ?On the Day of the Test: ?Drink plenty of water until 1 hour prior to the test. ?Do not eat any food 4 hours prior to the test. ?You may take your regular medications prior to the test.  ?Take metoprolol 100 mg (Lopressor) two hours prior to test. ?FEMALES- please wear underwire-free bra if available, avoid dresses & tight clothing ? ?After the Test: ?Drink plenty of water. ?After  receiving IV contrast, you may experience a mild flushed feeling. This is normal. ?On occasion, you may experience a mild rash up to 24 hours after the test. This is not dangerous. If this occurs, you can take Benadryl 25 mg and increase your fluid intake. ?If you experience trouble breathing, this can be serious. If it is severe call 911 IMMEDIATELY. If it is mild, please call our office. ? ?We will call to schedule your test 2-4 weeks out understanding that some insurance companies will need an authorization prior to the service being performed.  ? ?For non-scheduling related questions, please contact the cardiac imaging nurse navigator should you have any questions/concerns: ?Rockwell Alexandria, Cardiac Imaging Nurse Navigator ?Larey Brick, Cardiac Imaging Nurse Navigator ?[facility] ?Direct Office Dial: [phone]  ? ?For scheduling needs, including cancellations and rescheduling, please call [place], [phone].  ?

## 2021-08-19 NOTE — Addendum Note (Signed)
Addended by: Eustace Moore on: 08/19/2021 09:37 AM ? ? Modules accepted: Orders ? ?

## 2021-08-21 DIAGNOSIS — K047 Periapical abscess without sinus: Secondary | ICD-10-CM | POA: Diagnosis not present

## 2021-08-21 DIAGNOSIS — K0889 Other specified disorders of teeth and supporting structures: Secondary | ICD-10-CM | POA: Diagnosis not present

## 2021-08-21 DIAGNOSIS — Z720 Tobacco use: Secondary | ICD-10-CM | POA: Diagnosis not present

## 2021-09-03 ENCOUNTER — Telehealth (HOSPITAL_COMMUNITY): Payer: Self-pay | Admitting: Emergency Medicine

## 2021-09-03 NOTE — Telephone Encounter (Signed)
Reaching out to patient to offer assistance regarding upcoming cardiac imaging study; pt verbalizes understanding of appt date/time, parking situation and where to check in, pre-test NPO status and medications ordered, and verified current allergies; name and call back number provided for further questions should they arise Molly Bond RN Navigator Cardiac Imaging [facility] [phone] office [phone] cell  100mg  metoprolol Denies iv issues Arrival 900

## 2021-09-07 ENCOUNTER — Ambulatory Visit (HOSPITAL_COMMUNITY)
Admission: RE | Admit: 2021-09-07 | Discharge: 2021-09-07 | Disposition: A | Discharge status: Home / Self Care | Payer: Medicare Other | Source: Ambulatory Visit | Attending: Cardiology | Admitting: Cardiology

## 2021-09-07 DIAGNOSIS — I700 Atherosclerosis of aorta: Secondary | ICD-10-CM | POA: Diagnosis not present

## 2021-09-07 DIAGNOSIS — Z8249 Family history of ischemic heart disease and other diseases of the circulatory system: Secondary | ICD-10-CM | POA: Insufficient documentation

## 2021-09-07 DIAGNOSIS — I2510 Atherosclerotic heart disease of native coronary artery without angina pectoris: Secondary | ICD-10-CM | POA: Diagnosis not present

## 2021-09-07 DIAGNOSIS — R079 Chest pain, unspecified: Secondary | ICD-10-CM | POA: Diagnosis not present

## 2021-09-07 DIAGNOSIS — I25119 Atherosclerotic heart disease of native coronary artery with unspecified angina pectoris: Secondary | ICD-10-CM | POA: Insufficient documentation

## 2021-09-07 MED ORDER — NITROGLYCERIN 0.4 MG SL SUBL
SUBLINGUAL_TABLET | SUBLINGUAL | Status: AC
  Filled 2021-09-07: qty 2

## 2021-09-07 MED ORDER — NITROGLYCERIN 0.4 MG SL SUBL
0.8000 mg | SUBLINGUAL_TABLET | Freq: Once | SUBLINGUAL | Status: AC
  Administered 2021-09-07: 0.8 mg via SUBLINGUAL

## 2021-09-07 MED ORDER — IOHEXOL 350 MG/ML SOLN
95.0000 mL | Freq: Once | INTRAVENOUS | Status: AC | PRN
  Administered 2021-09-07: 95 mL via INTRAVENOUS

## 2021-10-22 DIAGNOSIS — M25561 Pain in right knee: Secondary | ICD-10-CM | POA: Diagnosis not present

## 2021-11-25 DIAGNOSIS — J441 Chronic obstructive pulmonary disease with (acute) exacerbation: Secondary | ICD-10-CM | POA: Diagnosis not present

## 2021-11-25 DIAGNOSIS — J209 Acute bronchitis, unspecified: Secondary | ICD-10-CM | POA: Diagnosis not present

## 2021-11-25 DIAGNOSIS — I10 Essential (primary) hypertension: Secondary | ICD-10-CM | POA: Diagnosis not present

## 2022-03-14 DIAGNOSIS — J449 Chronic obstructive pulmonary disease, unspecified: Secondary | ICD-10-CM | POA: Diagnosis not present

## 2022-03-14 DIAGNOSIS — I10 Essential (primary) hypertension: Secondary | ICD-10-CM | POA: Diagnosis not present

## 2022-03-14 DIAGNOSIS — F17200 Nicotine dependence, unspecified, uncomplicated: Secondary | ICD-10-CM | POA: Diagnosis not present

## 2022-06-03 DIAGNOSIS — M25561 Pain in right knee: Secondary | ICD-10-CM | POA: Diagnosis not present

## 2022-06-14 DIAGNOSIS — M25461 Effusion, right knee: Secondary | ICD-10-CM | POA: Diagnosis not present

## 2022-06-14 DIAGNOSIS — M7121 Synovial cyst of popliteal space [Baker], right knee: Secondary | ICD-10-CM | POA: Diagnosis not present

## 2022-06-14 DIAGNOSIS — M25561 Pain in right knee: Secondary | ICD-10-CM | POA: Diagnosis not present

## 2022-06-14 DIAGNOSIS — S83281A Other tear of lateral meniscus, current injury, right knee, initial encounter: Secondary | ICD-10-CM | POA: Diagnosis not present

## 2022-06-15 ENCOUNTER — Other Ambulatory Visit: Payer: Self-pay | Admitting: Internal Medicine

## 2022-06-15 DIAGNOSIS — Z1231 Encounter for screening mammogram for malignant neoplasm of breast: Secondary | ICD-10-CM

## 2022-06-17 DIAGNOSIS — M25561 Pain in right knee: Secondary | ICD-10-CM | POA: Diagnosis not present

## 2022-06-24 ENCOUNTER — Ambulatory Visit
Admission: RE | Admit: 2022-06-24 | Discharge: 2022-06-24 | Disposition: A | Discharge status: Home / Self Care | Payer: Medicare Other | Source: Ambulatory Visit | Attending: Internal Medicine | Admitting: Internal Medicine

## 2022-06-24 DIAGNOSIS — Z1231 Encounter for screening mammogram for malignant neoplasm of breast: Secondary | ICD-10-CM

## 2022-07-04 DIAGNOSIS — M25561 Pain in right knee: Secondary | ICD-10-CM | POA: Diagnosis not present

## 2022-07-08 DIAGNOSIS — R7989 Other specified abnormal findings of blood chemistry: Secondary | ICD-10-CM | POA: Diagnosis not present

## 2022-07-08 DIAGNOSIS — J449 Chronic obstructive pulmonary disease, unspecified: Secondary | ICD-10-CM | POA: Diagnosis not present

## 2022-07-08 DIAGNOSIS — I700 Atherosclerosis of aorta: Secondary | ICD-10-CM | POA: Diagnosis not present

## 2022-07-08 DIAGNOSIS — D518 Other vitamin B12 deficiency anemias: Secondary | ICD-10-CM | POA: Diagnosis not present

## 2022-07-08 DIAGNOSIS — Z0001 Encounter for general adult medical examination with abnormal findings: Secondary | ICD-10-CM | POA: Diagnosis not present

## 2022-07-08 DIAGNOSIS — G9332 Myalgic encephalomyelitis/chronic fatigue syndrome: Secondary | ICD-10-CM | POA: Diagnosis not present

## 2022-07-08 DIAGNOSIS — F17200 Nicotine dependence, unspecified, uncomplicated: Secondary | ICD-10-CM | POA: Diagnosis not present

## 2022-07-08 DIAGNOSIS — I10 Essential (primary) hypertension: Secondary | ICD-10-CM | POA: Diagnosis not present

## 2022-07-08 DIAGNOSIS — E559 Vitamin D deficiency, unspecified: Secondary | ICD-10-CM | POA: Diagnosis not present

## 2022-08-15 DIAGNOSIS — R221 Localized swelling, mass and lump, neck: Secondary | ICD-10-CM | POA: Diagnosis not present

## 2022-08-15 DIAGNOSIS — W57XXXA Bitten or stung by nonvenomous insect and other nonvenomous arthropods, initial encounter: Secondary | ICD-10-CM | POA: Diagnosis not present

## 2022-08-15 DIAGNOSIS — R030 Elevated blood-pressure reading, without diagnosis of hypertension: Secondary | ICD-10-CM | POA: Diagnosis not present

## 2022-08-15 DIAGNOSIS — J209 Acute bronchitis, unspecified: Secondary | ICD-10-CM | POA: Diagnosis not present

## 2022-09-05 DIAGNOSIS — L989 Disorder of the skin and subcutaneous tissue, unspecified: Secondary | ICD-10-CM | POA: Diagnosis not present

## 2022-09-05 DIAGNOSIS — W57XXXA Bitten or stung by nonvenomous insect and other nonvenomous arthropods, initial encounter: Secondary | ICD-10-CM | POA: Diagnosis not present

## 2022-09-05 DIAGNOSIS — R030 Elevated blood-pressure reading, without diagnosis of hypertension: Secondary | ICD-10-CM | POA: Diagnosis not present

## 2022-09-15 IMAGING — CT CT HEART MORP W/ CTA COR W/ SCORE W/ CA W/CM &/OR W/O CM
4 of 7 series · 8 of 20 positions shown, 9 images · IV contrast (omnipaque)
Comparison: Chest CT April 19, 2017.
COMPARISON: Chest CT April 19, 2017.

Addendum:
EXAM:
OVER-READ INTERPRETATION  CT CHEST

The following report is a limited chest CT over-read performed by
09/07/2021. This over-read does not include interpretation of cardiac
or coronary anatomy or pathology. The coronary calcium and cardiac
CTA interpretation by the cardiologist is attached.
HISTORY: Chest pain/anginal equiv, prior revascularization
Cardiac/Coronary CT
TECHNIQUE: The patient was scanned on a Siemens Force scanner.
PROTOCOL: A 120 kV prospective scan was triggered in the descending thoracic
aorta at 111 HU's. Axial non-contrast 3 mm slices were carried out
through the heart. The data set was analyzed on a dedicated work
station and scored using the Agatston method. Gantry rotation speed
was 250 msecs and collimation was .6 mm. Heart rate was optimized
medically and sl NTG was given. The 3D data set was reconstructed in
5% intervals of the 35-75 % of the R-R cycle. Systolic and diastolic
phases were analyzed on a dedicated work station using MPR, MIP and
VRT modes. The patient received 95mL OMNIPAQUE IOHEXOL 350 MG/ML
SOLN of contrast.

[Series 6: ts diast sharp · axial · 0.39mm/px · z∈[-194,-158]mm · 2 of 273 slices shown]
[im 91/273  lung]
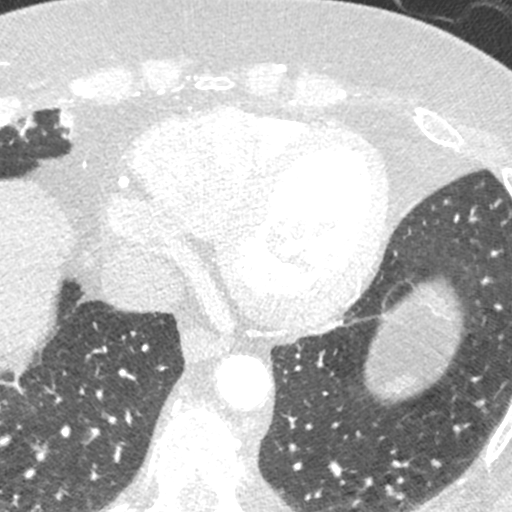
[im 182/273  lung]
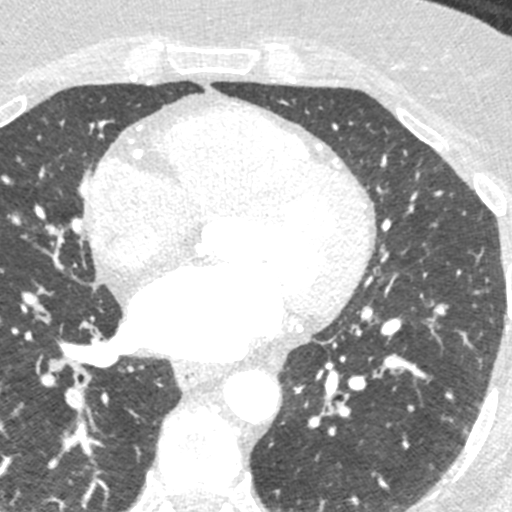

[Series 7: ts syst sharp · axial · 0.39mm/px · z∈[-194,-158]mm · 2 of 273 slices shown]
[im 91/273  lung]
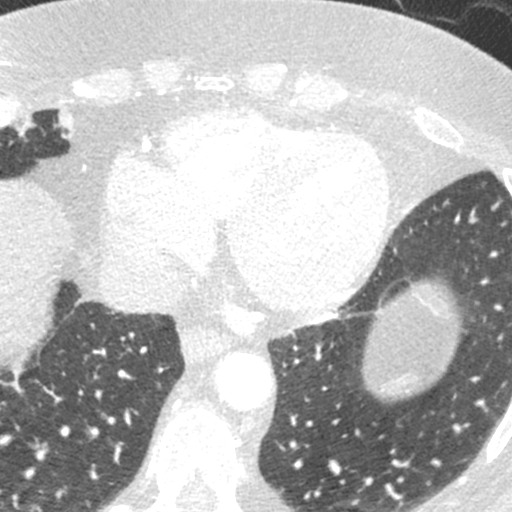
[im 182/273  lung]
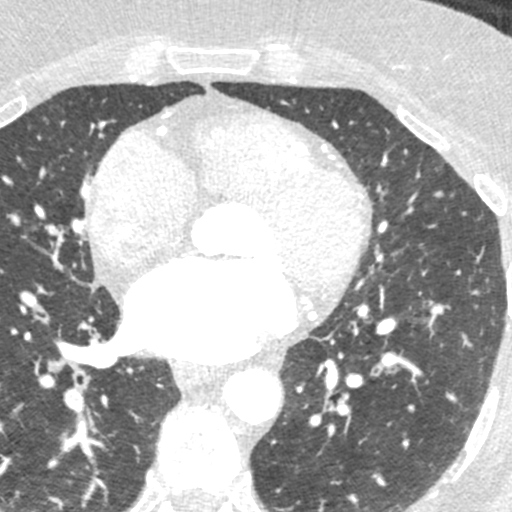

[Series 8: best syst · axial · 0.39mm/px · z∈[-194,-158]mm · 2 of 273 slices shown, 3 images]
[im 91/273  vessel]
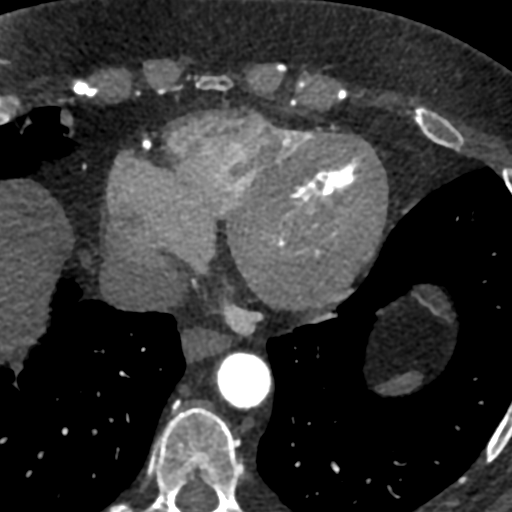
[im 91/273  lung]
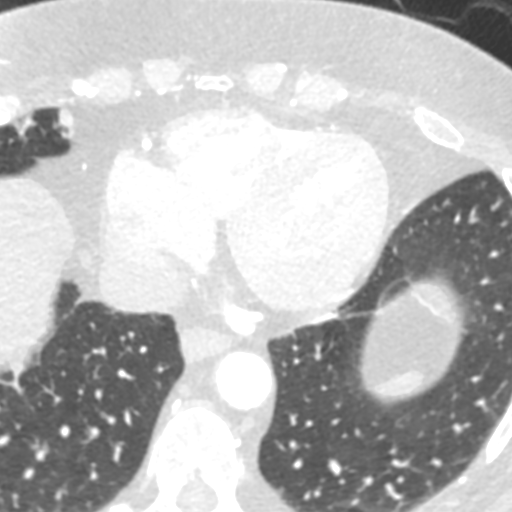
[im 182/273  vessel]
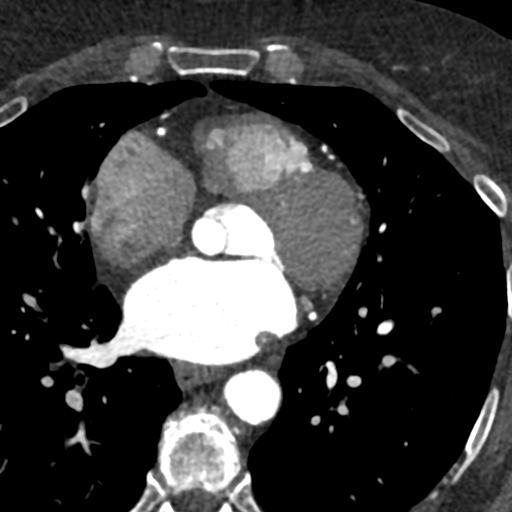

[Series 9: best diast · axial · 0.39mm/px · z∈[-194,-158]mm · 2 of 273 slices shown]
[im 91/273  vessel]
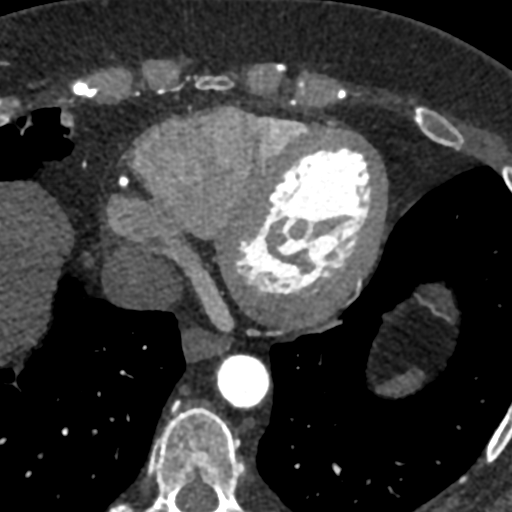
[im 182/273  vessel]
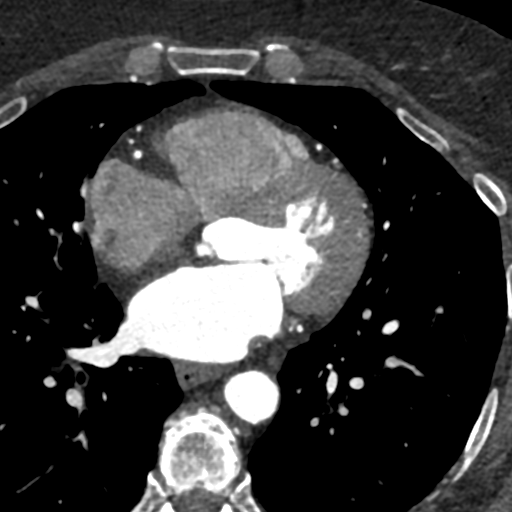

[8 of 20 positions shown; findings below may reference images not displayed]

FINDINGS: Vascular: Aortic atherosclerosis. No acute noncardiac vascular
finding.

Mediastinum/Nodes: No pathologically enlarged mediastinal or hilar
lymph nodes in the visualized portions of the thorax. Distal
esophagus is grossly unremarkable.

Lungs/Pleura: Mild diffuse bronchial wall thickening. Right lower
lobe subsegmental atelectasis. Within the visualized portions of the
thorax there is no suspicious pulmonary nodules or masses and no
pleural effusion or pneumothorax.

Upper Abdomen: No acute abnormality.

Musculoskeletal: No acute osseous abnormality.
IMPRESSION: 1. Mild diffuse bronchial wall thickening, suggesting bronchitis.
2.  Aortic Atherosclerosis ([id number]-RAX.X).
FINDINGS: Coronary calcium score: The patient's coronary artery calcium score
is 259, which places the patient in the 95th percentile.

Coronary arteries: Normal coronary origins.  Right dominance.

Right Coronary Artery: Normal caliber vessel, gives rise to PDA.
Focal proximal RCA mixed calcified and noncalcified plaque with
1-24% stenosis.

Left Main Coronary Artery: Normal caliber vessel. Distal left main
with calcified plaque and 1-24% stenosis.

Left Anterior Descending Coronary Artery: Normal caliber vessel.
Proximal LAD with calcified plaque and 1-24% stenosis. Gives rise to
large first and normal second diagonal branches.

Left Circumflex Artery: Normal caliber vessel. No significant plaque
or stenosis. Gives rise to 2 OM branches.

Aorta: Normal size, 26 mm at the mid ascending aorta (level of the
PA bifurcation) measured double oblique. Scattered aortic
atherosclerosis. No dissection seen in visualized portions of the
aorta.

Aortic Valve: No calcifications. Trileaflet.

Other findings:

Normal pulmonary vein drainage into the left atrium.

Normal left atrial appendage without a thrombus.

Normal size of the pulmonary artery.

Normal appearance of the pericardium.
IMPRESSION: 1.  Minimal nonobstructive CAD, CADRADS = 1.

2. Coronary calcium score of 259. This was 95th percentile for age
and sex matched control.

3. Normal coronary origin with right dominance.

4.  Aortic atherosclerosis.

INTERPRETATION:

1. CAD-RADS 0: No evidence of CAD (0%). Consider non-atherosclerotic
causes of chest pain.

2. CAD-RADS 1: Minimal non-obstructive CAD (0-24%). Consider
non-atherosclerotic causes of chest pain. Consider preventive
therapy and risk factor modification.

3. CAD-RADS 2: Mild non-obstructive CAD (25-49%). Consider
non-atherosclerotic causes of chest pain. Consider preventive
therapy and risk factor modification.

4. CAD-RADS 3: Moderate stenosis (50-69%). Consider symptom-guided
anti-ischemic pharmacotherapy as well as risk factor modification
per guideline directed care. Additional analysis with CT FFR will be
submitted.

5. CAD-RADS 4: Severe stenosis. (70-99% or > 50% left main). Cardiac
catheterization or CT FFR is recommended. Consider symptom-guided
anti-ischemic pharmacotherapy as well as risk factor modification
per guideline directed care. Invasive coronary angiography
recommended with revascularization per published guideline
statements.

6. CAD-RADS 5: Total coronary occlusion (100%). Consider cardiac
catheterization or viability assessment. Consider symptom-guided
anti-ischemic pharmacotherapy as well as risk factor modification
per guideline directed care.

7. CAD-RADS N: Non-diagnostic study. Obstructive CAD can't be
excluded. Alternative evaluation is recommended.

*** End of Addendum ***
EXAM:
OVER-READ INTERPRETATION  CT CHEST

The following report is a limited chest CT over-read performed by
09/07/2021. This over-read does not include interpretation of cardiac
or coronary anatomy or pathology. The coronary calcium and cardiac
CTA interpretation by the cardiologist is attached.
FINDINGS: Vascular: Aortic atherosclerosis. No acute noncardiac vascular
finding.

Mediastinum/Nodes: No pathologically enlarged mediastinal or hilar
lymph nodes in the visualized portions of the thorax. Distal
esophagus is grossly unremarkable.

Lungs/Pleura: Mild diffuse bronchial wall thickening. Right lower
lobe subsegmental atelectasis. Within the visualized portions of the
thorax there is no suspicious pulmonary nodules or masses and no
pleural effusion or pneumothorax.

Upper Abdomen: No acute abnormality.

Musculoskeletal: No acute osseous abnormality.
IMPRESSION: 1. Mild diffuse bronchial wall thickening, suggesting bronchitis.
2.  Aortic Atherosclerosis ([id number]-[id number].X).

## 2022-09-29 DIAGNOSIS — J329 Chronic sinusitis, unspecified: Secondary | ICD-10-CM | POA: Diagnosis not present

## 2022-09-29 DIAGNOSIS — J449 Chronic obstructive pulmonary disease, unspecified: Secondary | ICD-10-CM | POA: Diagnosis not present

## 2022-09-29 DIAGNOSIS — F17200 Nicotine dependence, unspecified, uncomplicated: Secondary | ICD-10-CM | POA: Diagnosis not present

## 2022-09-29 DIAGNOSIS — I700 Atherosclerosis of aorta: Secondary | ICD-10-CM | POA: Diagnosis not present

## 2022-09-29 DIAGNOSIS — I10 Essential (primary) hypertension: Secondary | ICD-10-CM | POA: Diagnosis not present

## 2022-12-08 ENCOUNTER — Other Ambulatory Visit (HOSPITAL_COMMUNITY): Payer: Self-pay | Admitting: Family Medicine

## 2022-12-08 DIAGNOSIS — R030 Elevated blood-pressure reading, without diagnosis of hypertension: Secondary | ICD-10-CM | POA: Diagnosis not present

## 2022-12-08 DIAGNOSIS — I739 Peripheral vascular disease, unspecified: Secondary | ICD-10-CM | POA: Diagnosis not present

## 2022-12-08 DIAGNOSIS — R609 Edema, unspecified: Secondary | ICD-10-CM | POA: Diagnosis not present

## 2022-12-08 DIAGNOSIS — F17200 Nicotine dependence, unspecified, uncomplicated: Secondary | ICD-10-CM | POA: Diagnosis not present

## 2022-12-08 DIAGNOSIS — S62607A Fracture of unspecified phalanx of left little finger, initial encounter for closed fracture: Secondary | ICD-10-CM | POA: Diagnosis not present

## 2022-12-08 DIAGNOSIS — I10 Essential (primary) hypertension: Secondary | ICD-10-CM | POA: Diagnosis not present

## 2022-12-09 ENCOUNTER — Ambulatory Visit (HOSPITAL_COMMUNITY)
Admission: RE | Admit: 2022-12-09 | Discharge: 2022-12-09 | Disposition: A | Discharge status: Home / Self Care | Payer: Medicare Other | Source: Ambulatory Visit | Attending: Family Medicine | Admitting: Family Medicine

## 2022-12-09 DIAGNOSIS — I739 Peripheral vascular disease, unspecified: Secondary | ICD-10-CM | POA: Diagnosis not present

## 2023-01-16 DIAGNOSIS — J449 Chronic obstructive pulmonary disease, unspecified: Secondary | ICD-10-CM | POA: Diagnosis not present

## 2023-01-16 DIAGNOSIS — I700 Atherosclerosis of aorta: Secondary | ICD-10-CM | POA: Diagnosis not present

## 2023-01-16 DIAGNOSIS — I10 Essential (primary) hypertension: Secondary | ICD-10-CM | POA: Diagnosis not present

## 2023-03-15 DIAGNOSIS — I10 Essential (primary) hypertension: Secondary | ICD-10-CM | POA: Diagnosis not present

## 2023-03-15 DIAGNOSIS — J329 Chronic sinusitis, unspecified: Secondary | ICD-10-CM | POA: Diagnosis not present

## 2023-03-15 DIAGNOSIS — J441 Chronic obstructive pulmonary disease with (acute) exacerbation: Secondary | ICD-10-CM | POA: Diagnosis not present

## 2023-09-19 DIAGNOSIS — M25552 Pain in left hip: Secondary | ICD-10-CM | POA: Diagnosis not present

## 2023-09-19 DIAGNOSIS — M25551 Pain in right hip: Secondary | ICD-10-CM | POA: Diagnosis not present

## 2023-09-19 DIAGNOSIS — M545 Low back pain, unspecified: Secondary | ICD-10-CM | POA: Diagnosis not present

## 2023-10-03 DIAGNOSIS — M1611 Unilateral primary osteoarthritis, right hip: Secondary | ICD-10-CM | POA: Diagnosis not present

## 2023-10-22 DIAGNOSIS — R0602 Shortness of breath: Secondary | ICD-10-CM | POA: Diagnosis not present

## 2023-10-22 DIAGNOSIS — J441 Chronic obstructive pulmonary disease with (acute) exacerbation: Secondary | ICD-10-CM | POA: Diagnosis not present

## 2023-10-22 DIAGNOSIS — R059 Cough, unspecified: Secondary | ICD-10-CM | POA: Diagnosis not present

## 2023-10-22 DIAGNOSIS — I10 Essential (primary) hypertension: Secondary | ICD-10-CM | POA: Diagnosis not present
